# Patient Record
Sex: Female | Born: 1985
Health system: Southern US, Community
[De-identification: ages and names within clinical notes are randomized; demographics above are authoritative.]

## PROBLEM LIST (undated history)

## (undated) ENCOUNTER — Inpatient Hospital Stay: Payer: Self-pay

## (undated) DIAGNOSIS — G43909 Migraine, unspecified, not intractable, without status migrainosus: Secondary | ICD-10-CM

## (undated) DIAGNOSIS — U071 COVID-19: Secondary | ICD-10-CM

## (undated) DIAGNOSIS — I82409 Acute embolism and thrombosis of unspecified deep veins of unspecified lower extremity: Secondary | ICD-10-CM

## (undated) DIAGNOSIS — R87619 Unspecified abnormal cytological findings in specimens from cervix uteri: Secondary | ICD-10-CM

## (undated) DIAGNOSIS — A64 Unspecified sexually transmitted disease: Secondary | ICD-10-CM

## (undated) HISTORY — DX: Unspecified abnormal cytological findings in specimens from cervix uteri: R87.619

## (undated) HISTORY — DX: Unspecified sexually transmitted disease: A64

## (undated) HISTORY — DX: Acute embolism and thrombosis of unspecified deep veins of unspecified lower extremity: I82.409

## (undated) HISTORY — DX: Migraine, unspecified, not intractable, without status migrainosus: G43.909

## (undated) HISTORY — DX: COVID-19: U07.1

---

## 2007-11-16 HISTORY — PX: CERVICAL BIOPSY  W/ LOOP ELECTRODE EXCISION: SUR135

## 2008-10-04 ENCOUNTER — Ambulatory Visit: Payer: Self-pay

## 2008-10-08 ENCOUNTER — Ambulatory Visit: Payer: Self-pay

## 2011-10-28 DIAGNOSIS — J040 Acute laryngitis: Secondary | ICD-10-CM

## 2011-10-28 DIAGNOSIS — J029 Acute pharyngitis, unspecified: Secondary | ICD-10-CM | POA: Insufficient documentation

## 2011-10-28 HISTORY — DX: Acute pharyngitis, unspecified: J02.9

## 2011-10-28 HISTORY — DX: Acute laryngitis: J04.0

## 2012-11-15 DIAGNOSIS — I82409 Acute embolism and thrombosis of unspecified deep veins of unspecified lower extremity: Secondary | ICD-10-CM

## 2012-11-15 HISTORY — DX: Acute embolism and thrombosis of unspecified deep veins of unspecified lower extremity: I82.409

## 2013-08-16 ENCOUNTER — Emergency Department: Payer: Self-pay | Admitting: Internal Medicine

## 2013-08-16 ENCOUNTER — Ambulatory Visit: Payer: Self-pay | Admitting: Family Medicine

## 2013-08-16 LAB — BASIC METABOLIC PANEL
Anion Gap: 7 (ref 7–16)
BUN: 10 mg/dL (ref 7–18)
Chloride: 105 mmol/L (ref 98–107)
Co2: 25 mmol/L (ref 21–32)
Glucose: 76 mg/dL (ref 65–99)
Osmolality: 272 (ref 275–301)
Potassium: 3.6 mmol/L (ref 3.5–5.1)
Sodium: 137 mmol/L (ref 136–145)

## 2013-08-16 LAB — CBC
MCHC: 34.4 g/dL (ref 32.0–36.0)
MCV: 93 fL (ref 80–100)
Platelet: 200 10*3/uL (ref 150–440)
RBC: 3.93 10*6/uL (ref 3.80–5.20)
RDW: 13.4 % (ref 11.5–14.5)

## 2013-08-22 ENCOUNTER — Ambulatory Visit: Payer: Self-pay | Admitting: Hematology and Oncology

## 2013-08-24 ENCOUNTER — Ambulatory Visit: Payer: Self-pay | Admitting: Hematology and Oncology

## 2013-08-24 LAB — TSH: Thyroid Stimulating Horm: 0.351 u[IU]/mL — ABNORMAL LOW

## 2013-09-15 ENCOUNTER — Ambulatory Visit: Payer: Self-pay | Admitting: Hematology and Oncology

## 2013-11-22 ENCOUNTER — Ambulatory Visit: Payer: Self-pay | Admitting: Hematology and Oncology

## 2013-11-22 LAB — CBC CANCER CENTER
Basophil #: 0 x10 3/mm (ref 0.0–0.1)
Basophil %: 0.9 %
EOS ABS: 0 x10 3/mm (ref 0.0–0.7)
Eosinophil %: 0.9 %
HCT: 40.3 % (ref 35.0–47.0)
HGB: 13.5 g/dL (ref 12.0–16.0)
LYMPHS PCT: 38.9 %
Lymphocyte #: 1.6 x10 3/mm (ref 1.0–3.6)
MCH: 30.9 pg (ref 26.0–34.0)
MCHC: 33.4 g/dL (ref 32.0–36.0)
MCV: 93 fL (ref 80–100)
MONO ABS: 0.4 x10 3/mm (ref 0.2–0.9)
Monocyte %: 10.5 %
NEUTROS PCT: 48.8 %
Neutrophil #: 2 x10 3/mm (ref 1.4–6.5)
Platelet: 171 x10 3/mm (ref 150–440)
RBC: 4.35 10*6/uL (ref 3.80–5.20)
RDW: 13.8 % (ref 11.5–14.5)
WBC: 4.1 x10 3/mm (ref 3.6–11.0)

## 2013-11-22 LAB — BASIC METABOLIC PANEL
ANION GAP: 9 (ref 7–16)
BUN: 8 mg/dL (ref 7–18)
CALCIUM: 8.9 mg/dL (ref 8.5–10.1)
CHLORIDE: 102 mmol/L (ref 98–107)
CREATININE: 0.84 mg/dL (ref 0.60–1.30)
Co2: 28 mmol/L (ref 21–32)
EGFR (African American): >60
EGFR (Non-African Amer.): >60
GLUCOSE: 102 mg/dL — AB (ref 65–99)
Osmolality: 276 (ref 275–301)
Potassium: 3.3 mmol/L — ABNORMAL LOW (ref 3.5–5.1)
Sodium: 139 mmol/L (ref 136–145)

## 2013-12-16 ENCOUNTER — Ambulatory Visit: Payer: Self-pay | Admitting: Hematology and Oncology

## 2014-01-13 ENCOUNTER — Ambulatory Visit: Payer: Self-pay | Admitting: Hematology and Oncology

## 2014-09-25 ENCOUNTER — Ambulatory Visit (INDEPENDENT_AMBULATORY_CARE_PROVIDER_SITE_OTHER): Payer: BC Managed Care – PPO | Admitting: General Surgery

## 2014-09-25 ENCOUNTER — Encounter: Payer: Self-pay | Admitting: General Surgery

## 2014-09-25 ENCOUNTER — Ambulatory Visit: Payer: BC Managed Care – PPO

## 2014-09-25 VITALS — BP 112/78 | HR 72 | Resp 14 | Ht 65.0 in | Wt 169.0 lb

## 2014-09-25 DIAGNOSIS — Z86718 Personal history of other venous thrombosis and embolism: Secondary | ICD-10-CM

## 2014-09-25 NOTE — Patient Instructions (Signed)
Patient to return in 1 month for follow up. The patient is aware to call back for any questions or concerns.  

## 2014-09-25 NOTE — Progress Notes (Signed)
Patient ID: Emily Griffin, female   DOB: Aug 24, 1986, 28 y.o.   MRN: [id number]  Chief Complaint  Patient presents with  . Other    History of DVT, shortness of breath and abdominal cramping    HPI Emily Griffin is a 28 y.o. female who presents for an evaluation of abdominal cramping and shortness of breath. The patient has a history of DVT in 2014 in the right leg. She states the shortness of breath started approximately 2 weeks ago. She notices it the  most when walking.  She also states she has had some tingling in the right leg that started approximately 2 days ago. She denies any pain in her legs at this time.   HPI  Past Medical History  Diagnosis Date  . DVT (deep venous thrombosis) 2014    right leg  . STD (female)     Past Surgical History  Procedure Laterality Date  . Cervical biopsy  w/ loop electrode excision  2009    Family History  Problem Relation Age of Onset  . Asthma Brother     Social History History  Substance Use Topics  . Smoking status: Never Smoker   . Smokeless tobacco: Never Used  . Alcohol Use: 0.0 oz/week    0 Not specified per week    Allergies  Allergen Reactions  . Amoxicillin Rash    No current outpatient prescriptions on file.   No current facility-administered medications for this visit.    Review of Systems Review of Systems  Constitutional: Negative.   Respiratory: Positive for shortness of breath.   Cardiovascular: Negative.   Gastrointestinal: Positive for abdominal pain.    Blood pressure 112/78, pulse 72, resp. rate 14, height 5\' 5"  (1.651 m), weight 169 lb (76.658 kg), last menstrual period 09/25/2014.  Physical Exam Physical Exam  Constitutional: She is oriented to person, place, and time. She appears well-developed and well-nourished.  Cardiovascular: Normal rate, regular rhythm and normal heart sounds.   No murmur heard. Pulses:      Dorsalis pedis pulses are 2+ on the right side, and 2+ on the left side.   Posterior tibial pulses are 2+ on the right side, and 2+ on the left side.  No edema in legs . No calf tenderness. No VV or stasis changes.  Pulmonary/Chest: Effort normal and breath sounds normal.  Abdominal: Soft. Normal appearance and bowel sounds are normal. There is no hepatosplenomegaly. There is no tenderness. No hernia.  Neurological: She is alert and oriented to person, place, and time.  Skin: Skin is warm and dry.    Data Reviewed Recent pelvic US- normal. Prior Duplex study reports showed initial clots in tibial veins of right leg  Assessment    History of DVT right tibial veins, treated with Xarelto. Duplex study done today showed no evidence of DVT in right fem-pop veins. Tibial veins were difficult to visualise.     No apparent findings of recurrent DVT  Plan    Recommnde proper use of compression stocking. Avoid prolonged sitting or standing. Recheck in 1 mo         SANKAR,SEEPLAPUTHUR G 09/25/2014, 1:33 PM

## 2014-10-24 ENCOUNTER — Ambulatory Visit: Payer: BC Managed Care – PPO | Admitting: General Surgery

## 2014-11-12 ENCOUNTER — Encounter: Payer: Self-pay | Admitting: *Deleted

## 2014-11-15 NOTE — L&D Delivery Note (Signed)
VAGINAL DELIVERY NOTE:  Date of Delivery: 11/04/2015 Primary OB: [facility]  Gestational Age/EDD: 5354w2d 11/09/2015, by Last Menstrual Period Antepartum complications: h/o DVT on Lovenox Attending Physician: Annamarie MajorPaul Sophi Calligan, MD, FACOG Delivery Type: spontaneous vaginal delivery  Anesthesia: epidural Laceration: 1st degree minor Episiotomy: none Placenta: spontaneous Intrapartum complications: None Estimated Blood Loss: <100 mL GBS: Neg Baby: Liveborn female, Apgars 8/9

## 2015-03-31 ENCOUNTER — Ambulatory Visit
Admission: RE | Admit: 2015-03-31 | Discharge: 2015-03-31 | Disposition: A | Discharge status: Home / Self Care | Payer: BLUE CROSS/BLUE SHIELD | Source: Ambulatory Visit | Attending: Maternal & Fetal Medicine | Admitting: Maternal & Fetal Medicine

## 2015-03-31 ENCOUNTER — Encounter: Payer: Self-pay | Admitting: Maternal & Fetal Medicine

## 2015-03-31 NOTE — Progress Notes (Unsigned)
MFM Consultation  Referring Provider: [facility]  29 yo G2P0010 at 8 1/[redacted] weeks gestation (EDD 11/10/15) by LMP 02/02/2015 consistent with [facility]; CRL was consistent with 7 weeks 4 days.  She is referred for consultation due to history of right distal popliteal thrombosis in 2014 while taking OCPs.  She was treated with xeralta for 3 months.  She reports a hematology evaluation to test for genetic and other underlying causes for DVT but results are not available to me(heme consult not in EMR or received records).  She reports negative testing.    History OB History    Gravida Para Term Preterm AB TAB SAB Ectopic Multiple Living   2 0     0         Obstetric Comments   1st Menstrual Cycle:  13 1st Pregnancy:  25     she reports history of abnormal pap smear and LEEP EAb x 1 (per records)  Past Medical History History of DVT, as above   Past Surgical History  Procedure Laterality Date  . Cervical biopsy  w/ loop electrode excision  2009   Family History: family history includes Asthma in her brother. she reports vascular insufficiency in grand mother and brother (brother is also being evaluated for evidence of dvts, per pt.   Social History: she works in [employer] (from home) does not drink presently (did drink alcohol prior to pregnancy), no tobacco or ilicit drug use  Allergies  Allergen Reactions  . Amoxicillin Rash   Current Outpatient Prescriptions on File Prior to Visit  Medication Sig Dispense Refill  . aspirin 81 MG tablet Take 81 mg by mouth daily.    . Cetirizine HCl 10 MG CAPS Take 10 mg by mouth once.    . Prenatal Vit-Fe Fumarate-FA (PRENATAL MULTIVITAMIN) TABS tablet Take 1 tablet by mouth daily at 12 noon.     No current facility-administered medications on file prior to visit.      ROS see HPI    Last menstrual period 02/02/2015. Exam Physical Exam   Prenatal labs: ABO, Rh:  O positive Antibody:  negaive Rubella:   immune RPR:   non-reactive HBsAg:   negative HIV:   negative Gc/chlam: negative  Lab Results  Component Value Date   WBC 4.1 11/22/2013   HGB 13.5 11/22/2013   HCT 40.3 11/22/2013   MCV 93 11/22/2013   PLT 171 11/22/2013      Assessment/Plan: 1. 28 yo G2 P0010  With history of DVT while on OCPs.  She was treated with xeralta for 3 months.  We addressed the increased risk for thrombosis during pregnancy and postpartum. She reports evaluation by a hematologist in the past (for genetic and other causes of DVT) and was told she did not have underlying causes for DVT.  Those results were not available.  --Recommend prophylactic lovenox 40mg  Riggins daily  during pregnancy and for 6 weeks postpartum. She received lovenox teaching today and a prescription for her lovenox dose --fetal growth evaluation monthly beginning at 26-[redacted] weeks gestation. --Antenatal testing weekly beginning at 32-[redacted] weeks gestation, sooner if clinically indicated --delivery at 39-[redacted] weeks gestation --recommend holding lovenox with onset of labor or 12 hours before planned cesarean delivery--we addressed the primarily limitation of continued lovenox therapy during labor and cesarean delivery is the concern for spinal hematoma following regional anesthesia placement.  If urgent delivery is warranted or occurs during an interval less than 12h,  general anesthetic (in the case  of cesarean delivery) or narcotic analgesia ( in the setting of vaginal delivery) may be recommended --recommend initiating lovenox 12 hours after uncomplicated vaginal delivery or 24 hours after cesarean delivery    2. History of LEEP--We addressed the possible association between LEEP and cervical weakness or preterm delivery.   --recommend assessment of the cervical length at ~[redacted] weeks gestation (during the anatomic survey) if normal (>2.5 cm) recommend follow up in 2-3 weeks. If the cervical length is normal at the time of those two assessments,  routine, clinical surveillance for preterm birth can continue.    Alanda SlimMaria J Kenlyn Lose 03/31/2015, 3:00 PM

## 2015-08-08 LAB — OB RESULTS CONSOLE GC/CHLAMYDIA
Chlamydia: NEGATIVE
GC PROBE AMP, GENITAL: NEGATIVE

## 2015-08-08 LAB — OB RESULTS CONSOLE RUBELLA ANTIBODY, IGM: Rubella: IMMUNE

## 2015-08-08 LAB — OB RESULTS CONSOLE VARICELLA ZOSTER ANTIBODY, IGG: Varicella: IMMUNE

## 2015-08-08 LAB — OB RESULTS CONSOLE HEPATITIS B SURFACE ANTIGEN: HEP B S AG: NEGATIVE

## 2015-08-08 LAB — OB RESULTS CONSOLE ABO/RH: RH TYPE: POSITIVE

## 2015-08-08 LAB — OB RESULTS CONSOLE RPR: RPR: NONREACTIVE

## 2015-08-08 LAB — OB RESULTS CONSOLE ANTIBODY SCREEN: Antibody Screen: NEGATIVE

## 2015-08-08 LAB — OB RESULTS CONSOLE HIV ANTIBODY (ROUTINE TESTING): HIV: NONREACTIVE

## 2015-09-11 ENCOUNTER — Ambulatory Visit
Admission: RE | Admit: 2015-09-11 | Discharge: 2015-09-11 | Disposition: A | Discharge status: Home / Self Care | Payer: BLUE CROSS/BLUE SHIELD | Source: Ambulatory Visit | Attending: Obstetrics and Gynecology | Admitting: Obstetrics and Gynecology

## 2015-09-11 ENCOUNTER — Other Ambulatory Visit: Payer: Self-pay | Admitting: Obstetrics and Gynecology

## 2015-09-11 DIAGNOSIS — Z3A31 31 weeks gestation of pregnancy: Secondary | ICD-10-CM | POA: Diagnosis present

## 2015-09-11 DIAGNOSIS — R600 Localized edema: Secondary | ICD-10-CM

## 2015-10-06 LAB — OB RESULTS CONSOLE GBS: STREP GROUP B AG: NEGATIVE

## 2015-11-01 ENCOUNTER — Observation Stay
Admission: EM | Admit: 2015-11-01 | Discharge: 2015-11-01 | Disposition: A | Discharge status: Home / Self Care | Payer: BLUE CROSS/BLUE SHIELD | Source: Home / Self Care | Admitting: Obstetrics and Gynecology

## 2015-11-01 DIAGNOSIS — Z86718 Personal history of other venous thrombosis and embolism: Secondary | ICD-10-CM

## 2015-11-01 DIAGNOSIS — Z3A38 38 weeks gestation of pregnancy: Secondary | ICD-10-CM | POA: Insufficient documentation

## 2015-11-01 DIAGNOSIS — O0993 Supervision of high risk pregnancy, unspecified, third trimester: Secondary | ICD-10-CM

## 2015-11-01 NOTE — Progress Notes (Signed)
Pt was given d/c inst. And verbalized understanding.   She was then d/c home in stable condition with her parents.

## 2015-11-01 NOTE — Progress Notes (Signed)
Report given to Dr Jackson

## 2015-11-01 NOTE — Progress Notes (Signed)
Dr. Jean RosenthalJackson in to see patient and review strip,

## 2015-11-01 NOTE — OB Triage Note (Signed)
Pt to L&D with c/o contractions since 1400 q 15 minutes getting worse around 1900.  Denies ROM, VB   +FM

## 2015-11-01 NOTE — Final Progress Note (Signed)
Physician Final Progress Note  Patient ID: Emily BeltonLatavia Griffin MRN: [id number] DOB/AGE: 31-Feb-1987 31 y.o.  Admit date: 11/01/2015 Admitting provider: Conard NovakStephen D Indalecio Malmstrom, MD Discharge date: 11/01/2015   Admission Diagnoses:  1) intrauterine pregnancy at 5869w6d  2) history of DVT, taking prophylaxis 3) history of LEEP 4) supervision of high-risk pregnancy, 3rd trimester  Discharge Diagnoses:  1) intrauterine pregnancy at 5869w6d  2) history of DVT, taking prophylaxis 3) history of LEEP 4) supervision of high-risk pregnancy, 3rd trimester  Consults: None  Significant Findings/ Diagnostic Studies: None  Procedures: Non-stress test Baseline: 130bpm Variability: Moderate Accels: present Decels: absent  Discharge Condition: stable  Disposition: Final discharge disposition not confirmed  Diet: Regular diet  Discharge Activity: Activity as tolerated     Medication List    ASK your doctor about these medications        aspirin 81 MG tablet  Take 81 mg by mouth daily.     Cetirizine HCl 10 MG Caps  Take 10 mg by mouth once.     prenatal multivitamin Tabs tablet  Take 1 tablet by mouth daily at 12 noon.         Total time spent taking care of this patient: 20 minutes  Signed: Conard NovakJackson, Dalene Robards D, MD 11/01/2015, 10:02 PM

## 2015-11-03 ENCOUNTER — Inpatient Hospital Stay
Admission: EM | Admit: 2015-11-03 | Discharge: 2015-11-05 | DRG: 775 | Disposition: A | Discharge status: Home / Self Care | Payer: BLUE CROSS/BLUE SHIELD | Attending: Obstetrics & Gynecology | Admitting: Obstetrics & Gynecology

## 2015-11-03 ENCOUNTER — Encounter: Payer: Self-pay | Admitting: Anesthesiology

## 2015-11-03 ENCOUNTER — Inpatient Hospital Stay: Payer: BLUE CROSS/BLUE SHIELD | Admitting: Anesthesiology

## 2015-11-03 ENCOUNTER — Encounter: Payer: Self-pay | Admitting: *Deleted

## 2015-11-03 DIAGNOSIS — Z86718 Personal history of other venous thrombosis and embolism: Secondary | ICD-10-CM | POA: Diagnosis not present

## 2015-11-03 DIAGNOSIS — O09893 Supervision of other high risk pregnancies, third trimester: Secondary | ICD-10-CM

## 2015-11-03 DIAGNOSIS — Z8672 Personal history of thrombophlebitis: Secondary | ICD-10-CM

## 2015-11-03 DIAGNOSIS — Z7901 Long term (current) use of anticoagulants: Secondary | ICD-10-CM | POA: Diagnosis not present

## 2015-11-03 DIAGNOSIS — Z3A39 39 weeks gestation of pregnancy: Secondary | ICD-10-CM

## 2015-11-03 DIAGNOSIS — Z7982 Long term (current) use of aspirin: Secondary | ICD-10-CM

## 2015-11-03 LAB — CBC
HEMATOCRIT: 37.4 % (ref 35.0–47.0)
Hemoglobin: 12.4 g/dL (ref 12.0–16.0)
MCH: 30.7 pg (ref 26.0–34.0)
MCHC: 33.3 g/dL (ref 32.0–36.0)
MCV: 92.3 fL (ref 80.0–100.0)
Platelets: 185 10*3/uL (ref 150–440)
RBC: 4.05 MIL/uL (ref 3.80–5.20)
RDW: 17.8 % — AB (ref 11.5–14.5)
WBC: 12.7 10*3/uL — AB (ref 3.6–11.0)

## 2015-11-03 LAB — RAPID HIV SCREEN (HIV 1/2 AB+AG)
HIV 1/2 Antibodies: NONREACTIVE
HIV-1 P24 Antigen - HIV24: NONREACTIVE

## 2015-11-03 LAB — TYPE AND SCREEN
ABO/RH(D): O POS
Antibody Screen: NEGATIVE

## 2015-11-03 LAB — ABO/RH: ABO/RH(D): O POS

## 2015-11-03 MED ORDER — OXYTOCIN BOLUS FROM INFUSION: 500.0000 mL | INTRAVENOUS | Status: DC

## 2015-11-03 MED ORDER — OXYTOCIN 40 UNITS IN LACTATED RINGERS INFUSION - SIMPLE MED: 62.5000 mL/h | INTRAVENOUS | Status: DC

## 2015-11-03 MED ORDER — BUPIVACAINE HCL (PF) 0.25 % IJ SOLN
INTRAMUSCULAR | Status: DC | PRN
  Administered 2015-11-03: 5 mL via EPIDURAL
  Administered 2015-11-03: 3 mL via EPIDURAL

## 2015-11-03 MED ORDER — ONDANSETRON HCL 4 MG/2ML IJ SOLN: 4.0000 mg | Freq: Four times a day (QID) | INTRAMUSCULAR | Status: DC | PRN

## 2015-11-03 MED ORDER — BUTORPHANOL TARTRATE 1 MG/ML IJ SOLN: 1.0000 mg | INTRAMUSCULAR | Status: DC | PRN

## 2015-11-03 MED ORDER — ACETAMINOPHEN 325 MG PO TABS: 650.0000 mg | ORAL_TABLET | ORAL | Status: DC | PRN

## 2015-11-03 MED ORDER — LACTATED RINGERS IV SOLN
INTRAVENOUS | Status: DC
  Administered 2015-11-03 (×2): via INTRAVENOUS

## 2015-11-03 MED ORDER — FENTANYL 2.5 MCG/ML W/ROPIVACAINE 0.2% IN NS 100 ML EPIDURAL INFUSION (ARMC-ANES)
EPIDURAL | Status: AC
  Administered 2015-11-03: 9 mL/h via EPIDURAL
  Filled 2015-11-03: qty 100

## 2015-11-03 MED ORDER — LIDOCAINE HCL (PF) 1 % IJ SOLN
INTRAMUSCULAR | Status: DC | PRN
  Administered 2015-11-03: 3 mL via INTRADERMAL

## 2015-11-03 MED ORDER — OXYTOCIN 40 UNITS IN LACTATED RINGERS INFUSION - SIMPLE MED
1.0000 m[IU]/min | INTRAVENOUS | Status: DC
  Administered 2015-11-03: 1 m[IU]/min via INTRAVENOUS
  Filled 2015-11-03: qty 1000

## 2015-11-03 MED ORDER — TERBUTALINE SULFATE 1 MG/ML IJ SOLN: 0.2500 mg | Freq: Once | INTRAMUSCULAR | Status: DC | PRN

## 2015-11-03 MED ORDER — LIDOCAINE-EPINEPHRINE (PF) 1.5 %-1:200000 IJ SOLN
INTRAMUSCULAR | Status: DC | PRN
  Administered 2015-11-03: 3 mL via PERINEURAL

## 2015-11-03 MED ORDER — LACTATED RINGERS IV SOLN: 500.0000 mL | INTRAVENOUS | Status: DC | PRN

## 2015-11-03 NOTE — Progress Notes (Signed)
  Labor Progress Note   29 y.o. G2P0 @ 4929w1d , admitted for  Pregnancy, Labor Management. 40+ weeks.  Early labor on admission.  Subjective:  Painful ctxs improved w epidural   Objective:  BP 112/64 mmHg  Pulse 90  Temp(Src) 98.3 F (36.8 C) (Oral)  Resp 20  Ht 5\' 5"  (1.651 m)  Wt 210 lb (95.255 kg)  BMI 34.95 kg/m2  SpO2 98%  LMP 02/02/2015 Abd: moderate Extr: trace to 1+ bilateral pedal edema SVE: CERVIX: 7 cm dilated, 90 effaced, +1 station  EFM: FHR: 130 bpm, variability: moderate,  accelerations:  Present,  decelerations:  Present early and variable w ctxs Toco: Frequency: Every 2-4 minutes Labs: I have reviewed the patient's lab results.   Assessment & Plan:  G2P0 @ 829w1d, admitted for  Pregnancy and Labor/Delivery Management  1. Pain management: none. 2. FWB: FHT category 1.  3. ID: GBS negative 4. Labor management:   Pitocin for augmentation.  FSE applied.  All discussed with patient, see orders

## 2015-11-03 NOTE — Progress Notes (Signed)
  Labor Progress Note   29 y.o. G2P0 @ 2473w1d , admitted for  Pregnancy, Labor Management. 40+ weeks.  Early labor on admission.  Subjective:  Painful ctxs.   Objective:  BP 112/71 mmHg  Pulse 89  Temp(Src) 97.9 F (36.6 C) (Oral)  Resp 18  Ht 5\' 5"  (1.651 m)  Wt 210 lb (95.255 kg)  BMI 34.95 kg/m2  LMP 02/02/2015 Abd: moderate Extr: trace to 1+ bilateral pedal edema SVE: CERVIX: 4 cm dilated, 90 effaced, 0 station  EFM: FHR: 130 bpm, variability: moderate,  accelerations:  Present,  decelerations:  Absent Toco: Frequency: Every 10 minutes Labs: I have reviewed the patient's lab results.   Assessment & Plan:  G2P0 @ 173w1d, admitted for  Pregnancy and Labor/Delivery Management  1. Pain management: none. 2. FWB: FHT category 1.  3. ID: GBS negative 4. Labor management: AROM clear.  Pitocin for augmentation.    All discussed with patient, see orders

## 2015-11-03 NOTE — Progress Notes (Signed)
  Labor Progress Note   29 y.o. G2P0 @ 127w1d , admitted for  Pregnancy, Labor Management. 40+ weeks.  Early labor on admission.  Subjective:  Comfortable   Objective:  BP 125/79 mmHg  Pulse 93  Temp(Src) 99.4 F (37.4 C) (Oral)  Resp 20  Ht 5\' 5"  (1.651 m)  Wt 210 lb (95.255 kg)  BMI 34.95 kg/m2  SpO2 98%  LMP 02/02/2015 Abd: moderate Extr: trace to 1+ bilateral pedal edema SVE: CERVIX: 9 cm dilated, 90 effaced, +1 station  EFM: FHR: 130 bpm, variability: moderate,  accelerations:  Present,  decelerations:  Present early and variable w ctxs Toco: Frequency: Every 2-4 minutes Labs: I have reviewed the patient's lab results.   Assessment & Plan:  G2P0 @ 367w1d, admitted for  Pregnancy and Labor/Delivery Management  1. Pain management: none. 2. FWB: FHT category 1.  3. ID: GBS negative 4. Labor management:   Pitocin for augmentation.   All discussed with patient, see orders

## 2015-11-03 NOTE — Anesthesia Preprocedure Evaluation (Signed)
Anesthesia Evaluation  Patient identified by MRN, date of birth, ID band Patient awake    Reviewed: Allergy & Precautions, H&P , NPO status   History of Anesthesia Complications Negative for: history of anesthetic complications  Airway Mallampati: II  TM Distance: <3 FB Neck ROM: full    Dental no notable dental hx. (+) Teeth Intact   Pulmonary neg pulmonary ROS,    Pulmonary exam normal breath sounds clear to auscultation       Cardiovascular negative cardio ROS Normal cardiovascular exam Rhythm:regular Rate:Normal     Neuro/Psych negative neurological ROS  negative psych ROS   GI/Hepatic negative GI ROS, Neg liver ROS,   Endo/Other  negative endocrine ROS  Renal/GU negative Renal ROS  negative genitourinary   Musculoskeletal   Abdominal   Peds  Hematology   Anesthesia Other Findings   Reproductive/Obstetrics (+) Pregnancy                             Anesthesia Physical Anesthesia Plan  ASA: II  Anesthesia Plan: Epidural   Post-op Pain Management:    Induction:   Airway Management Planned:   Additional Equipment:   Intra-op Plan:   Post-operative Plan:   Informed Consent: I have reviewed the patients History and Physical, chart, labs and discussed the procedure including the risks, benefits and alternatives for the proposed anesthesia with the patient or authorized representative who has indicated his/her understanding and acceptance.     Plan Discussed with: Anesthesiologist and CRNA  Anesthesia Plan Comments:         Anesthesia Quick Evaluation

## 2015-11-03 NOTE — H&P (Signed)
Obstetrics Admission History & Physical   CC: ctxs and pain   HPI:  29 y.o. G2P0 @ 3874w1d (11/09/2015, by Last Menstrual Period). Admitted on 11/03/2015:   Patient Active Problem List   Diagnosis Date Noted  . Labor and delivery, indication for care 11/01/2015    Presents for ctx pain since 12/17, worse this am.  No VB or ROM.  Has not taken Lovenox in 2 days.  Denies leg cramps or SOB.Marland Kitchen.  [facility]  PMHx:  Past Medical History  Diagnosis Date  . DVT (deep venous thrombosis) (HCC) 2014    right leg  . STD (female)    PSHx:  Past Surgical History  Procedure Laterality Date  . Cervical biopsy  w/ loop electrode excision  2009   Medications:  Prescriptions prior to admission  Medication Sig Dispense Refill Last Dose  . enoxaparin (LOVENOX) 40 MG/0.4ML injection Inject 40 mg into the skin daily.   10/31/2015  . Prenatal Vit-Fe Fumarate-FA (PRENATAL MULTIVITAMIN) TABS tablet Take 1 tablet by mouth daily at 12 noon.   11/02/2015 at Unknown time  . valACYclovir (VALTREX) 500 MG tablet Take 500 mg by mouth 2 (two) times daily.   11/02/2015 at Unknown time  . aspirin 81 MG tablet Take 81 mg by mouth daily. Reported on 11/03/2015   Not Taking at Unknown time  . Cetirizine HCl 10 MG CAPS Take 10 mg by mouth once. Reported on 11/03/2015   Not Taking at Unknown time   Allergies: is allergic to amoxicillin. OBHx:  OB History  Gravida Para Term Preterm AB SAB TAB Ectopic Multiple Living  2 0    0        # Outcome Date GA Lbr Len/2nd Weight Sex Delivery Anes PTL Lv  2 Current           1 Gravida             Obstetric Comments  1st Menstrual Cycle:  13  1st Pregnancy:  25   ZOX:[id number]:Negative/unremarkable except as detailed in HPI. Soc Hx: Pregnancy welcomed  Objective:   Filed Vitals:   11/03/15 0936  BP: 112/71  Pulse: 89  Temp: 97.9 F (36.6 C)  Resp: 18   General: Well nourished, well developed female in no acute distress.  Skin: Warm and dry.   Cardiovascular:Regular rate and rhythm. Respiratory: Clear to auscultation bilateral. Normal respiratory effort Abdomen: moderate Neuro/Psych: Normal mood and affect.   Pelvic exam: is not limited by body habitus EGBUS: within normal limits Vagina: within normal limits and with normal mucosa blood in the vault Cervix: 3 cm Uterus: Spontaneous uterine activity  Adnexa: not evaluated  EFM:FHR: 130 bpm, variability: moderate,  accelerations:  Present,  decelerations:  Absent Toco: Frequency: Every 6-9 minutes   Perinatal info:  Blood type: O positive Rubella- Immune Varicella -Immune TDaP unkown RPR NR / HIV Neg/ HBsAg Neg  GBS NEG  Assessment & Plan:   29 y.o. G2P0 @ 1974w1d, Admitted on 11/03/2015: early active labor  Admit for labor, Observe for cervical change, Fetal Wellbeing Reassuring, Epidural when ready and AROM when Appropriate  Resume Lovenox 40mg  SQ daily after delivery (12 hours after vag delivery)

## 2015-11-03 NOTE — Anesthesia Procedure Notes (Signed)
Epidural Patient location during procedure: OB Start time: 11/03/2015 3:30 PM End time: 11/03/2015 4:00 PM  Staffing Resident/CRNA: Luca Dyar Performed by: resident/CRNA   Preanesthetic Checklist Completed: patient identified, site marked, surgical consent, pre-op evaluation, timeout performed, IV checked, risks and benefits discussed and monitors and equipment checked  Epidural Patient position: sitting Prep: Betadine Patient monitoring: heart rate, continuous pulse ox and blood pressure Approach: midline Location: L4-L5 Injection technique: LOR saline  Needle:  Needle type: Tuohy  Needle gauge: 18 G Needle length: 9 cm and 9 Catheter type: closed end flexible Catheter size: 20 Guage Test dose: negative and 1.5% lidocaine with Epi 1:200 K  Assessment Sensory level: T10 Events: blood not aspirated, injection not painful, no injection resistance, negative IV test and no paresthesia  Additional Notes   Patient tolerated the insertion well without complications.Reason for block:procedure for pain

## 2015-11-04 DIAGNOSIS — O09893 Supervision of other high risk pregnancies, third trimester: Secondary | ICD-10-CM | POA: Diagnosis not present

## 2015-11-04 LAB — CBC
HEMATOCRIT: 34.9 % — AB (ref 35.0–47.0)
Hemoglobin: 11.7 g/dL — ABNORMAL LOW (ref 12.0–16.0)
MCH: 31 pg (ref 26.0–34.0)
MCHC: 33.4 g/dL (ref 32.0–36.0)
MCV: 92.8 fL (ref 80.0–100.0)
PLATELETS: 163 10*3/uL (ref 150–440)
RBC: 3.76 MIL/uL — ABNORMAL LOW (ref 3.80–5.20)
RDW: 18.2 % — AB (ref 11.5–14.5)
WBC: 17.3 10*3/uL — AB (ref 3.6–11.0)

## 2015-11-04 LAB — RPR: RPR: NONREACTIVE

## 2015-11-04 MED ORDER — IBUPROFEN 600 MG PO TABS
600.0000 mg | ORAL_TABLET | Freq: Four times a day (QID) | ORAL | Status: DC
  Administered 2015-11-05 (×2): 600 mg via ORAL
  Filled 2015-11-04 (×3): qty 1

## 2015-11-04 MED ORDER — LIDOCAINE HCL (PF) 1 % IJ SOLN
INTRAMUSCULAR | Status: AC
  Filled 2015-11-04: qty 30

## 2015-11-04 MED ORDER — MISOPROSTOL 200 MCG PO TABS
ORAL_TABLET | ORAL | Status: AC
  Filled 2015-11-04: qty 4

## 2015-11-04 MED ORDER — ENOXAPARIN SODIUM 40 MG/0.4ML ~~LOC~~ SOLN
40.0000 mg | SUBCUTANEOUS | Status: DC
  Administered 2015-11-04: 40 mg via SUBCUTANEOUS
  Filled 2015-11-04: qty 0.4

## 2015-11-04 MED ORDER — SODIUM CHLORIDE 0.9 % IJ SOLN: 3.0000 mL | INTRAMUSCULAR | Status: DC | PRN

## 2015-11-04 MED ORDER — OXYTOCIN 40 UNITS IN LACTATED RINGERS INFUSION - SIMPLE MED: 62.5000 mL/h | INTRAVENOUS | Status: DC | PRN

## 2015-11-04 MED ORDER — FENTANYL 2.5 MCG/ML W/ROPIVACAINE 0.2% IN NS 100 ML EPIDURAL INFUSION (ARMC-ANES)
EPIDURAL | Status: AC
  Administered 2015-11-04: 250 ug
  Filled 2015-11-04: qty 100

## 2015-11-04 MED ORDER — SENNOSIDES-DOCUSATE SODIUM 8.6-50 MG PO TABS
2.0000 | ORAL_TABLET | ORAL | Status: DC
  Administered 2015-11-05: 2 via ORAL
  Filled 2015-11-04: qty 2

## 2015-11-04 MED ORDER — SIMETHICONE 80 MG PO CHEW: 80.0000 mg | CHEWABLE_TABLET | ORAL | Status: DC | PRN

## 2015-11-04 MED ORDER — OXYTOCIN 10 UNIT/ML IJ SOLN
INTRAMUSCULAR | Status: AC
  Filled 2015-11-04: qty 2

## 2015-11-04 MED ORDER — SODIUM CHLORIDE 0.9 % IJ SOLN: 3.0000 mL | Freq: Two times a day (BID) | INTRAMUSCULAR | Status: DC

## 2015-11-04 MED ORDER — OXYCODONE-ACETAMINOPHEN 5-325 MG PO TABS: 2.0000 | ORAL_TABLET | ORAL | Status: DC | PRN

## 2015-11-04 MED ORDER — ACETAMINOPHEN 325 MG PO TABS: 650.0000 mg | ORAL_TABLET | ORAL | Status: DC | PRN

## 2015-11-04 MED ORDER — INFLUENZA VAC SPLIT QUAD 0.5 ML IM SUSY
0.5000 mL | PREFILLED_SYRINGE | INTRAMUSCULAR | Status: DC
  Filled 2015-11-04: qty 0.5

## 2015-11-04 MED ORDER — DIBUCAINE 1 % RE OINT: 1.0000 "application " | TOPICAL_OINTMENT | RECTAL | Status: DC | PRN

## 2015-11-04 MED ORDER — DIPHENHYDRAMINE HCL 25 MG PO CAPS: 25.0000 mg | ORAL_CAPSULE | Freq: Four times a day (QID) | ORAL | Status: DC | PRN

## 2015-11-04 MED ORDER — TETANUS-DIPHTH-ACELL PERTUSSIS 5-2.5-18.5 LF-MCG/0.5 IM SUSP: 0.5000 mL | Freq: Once | INTRAMUSCULAR | Status: DC

## 2015-11-04 MED ORDER — LANOLIN HYDROUS EX OINT
TOPICAL_OINTMENT | CUTANEOUS | Status: DC | PRN
Start: 2015-11-04 — End: 2015-11-05

## 2015-11-04 MED ORDER — SODIUM CHLORIDE 0.9 % IV SOLN: 250.0000 mL | INTRAVENOUS | Status: DC | PRN

## 2015-11-04 MED ORDER — AMMONIA AROMATIC IN INHA
RESPIRATORY_TRACT | Status: AC
  Filled 2015-11-04: qty 10

## 2015-11-04 MED ORDER — ONDANSETRON HCL 4 MG PO TABS: 4.0000 mg | ORAL_TABLET | ORAL | Status: DC | PRN

## 2015-11-04 MED ORDER — BENZOCAINE-MENTHOL 20-0.5 % EX AERO
1.0000 "application " | INHALATION_SPRAY | CUTANEOUS | Status: DC | PRN
  Administered 2015-11-04: 1 via TOPICAL
  Filled 2015-11-04: qty 56

## 2015-11-04 MED ORDER — ZOLPIDEM TARTRATE 5 MG PO TABS: 5.0000 mg | ORAL_TABLET | Freq: Every evening | ORAL | Status: DC | PRN

## 2015-11-04 MED ORDER — WITCH HAZEL-GLYCERIN EX PADS: 1.0000 "application " | MEDICATED_PAD | CUTANEOUS | Status: DC | PRN

## 2015-11-04 MED ORDER — ONDANSETRON HCL 4 MG/2ML IJ SOLN: 4.0000 mg | INTRAMUSCULAR | Status: DC | PRN

## 2015-11-04 MED ORDER — OXYCODONE-ACETAMINOPHEN 5-325 MG PO TABS: 1.0000 | ORAL_TABLET | ORAL | Status: DC | PRN

## 2015-11-04 MED ORDER — IBUPROFEN 600 MG PO TABS
600.0000 mg | ORAL_TABLET | Freq: Four times a day (QID) | ORAL | Status: DC
  Administered 2015-11-04 (×2): 600 mg via ORAL
  Filled 2015-11-04 (×2): qty 1

## 2015-11-04 NOTE — Discharge Summary (Signed)
Obstetrical Discharge Summary  Date of Admission: 11/03/2015 Date of Discharge: 11/05/2015 Discharge Diagnosis: Term Pregnancy-delivered. History of DVT Primary OB:  [facility]   Gestational Age at Delivery: 611w2d  Antepartum complications: h/o DVT on Lovenox Date of Delivery: 11/04/15   Delivered By: Tiburcio PeaHarris Delivery Type: spontaneous vaginal delivery Intrapartum complications/course: None Anesthesia: epidural Placenta: spontaneous Laceration: 1st degree Episiotomy: none Live born Female  Birth Weight:  7#3oz/  APGAR: 8, 9   Post partum course: Since the delivery, patient has tolerated normal activity, diet, and daily functions without difficulty or complication.  Min lochia.  No breast concerns at this time.  No signs of depression currently. Has resumed Lovenox and knows to continue injections x 6 weeks.   Postpartum Exam: General: comfortable, in NAD, breastfeeding baby BP 106/66 mmHg  Pulse 75  Temp(Src) 98 F (36.7 C) (Oral)  Resp 18  Ht 5\' 5"  (1.651 m)  Wt 95.255 kg (210 lb)  BMI 34.95 kg/m2  SpO2 100%  LMP 02/02/2015  Breastfeeding? Unknown Abdomen: soft/ FF at U-3/ML/NT Lochia: appropriate Ext: no evidence of DVT   Disposition: home with infant Rh Immune globulin given: not applicable Rubella vaccine given: no Varicella vaccine given: no Tdap vaccine given in AP or PP setting: yes Contraception: to be determined at post partum visit / considering Liletta IUD Prenatal Labs: O POS//Rubella Immune//RPR negative//HIV negative/HepB Surface Ag negative//plans to breastfeed  Plan:  Graciella BeltonLatavia Flenner was discharged to home in good condition. [facility]  Discharge Medications:   Medication List    STOP taking these medications        aspirin 81 MG tablet     valACYclovir 500 MG tablet  Commonly known as:  VALTREX      TAKE these medications        benzocaine-Menthol 20-0.5 % Aero  Commonly known as:  DERMOPLAST   Apply 1 application topically as needed for irritation (perineal discomfort).     Cetirizine HCl 10 MG Caps  Take 10 mg by mouth once. Reported on 11/03/2015     enoxaparin 40 MG/0.4ML injection  Commonly known as:  LOVENOX  Inject 40 mg into the skin daily.     Influenza vac split quadrivalent PF 0.5 ML injection  Commonly known as:  FLUARIX  Inject 0.5 mLs into the muscle tomorrow at 10 am.     lanolin Oint  Apply 1 application topically as needed (for breast care).     prenatal multivitamin Tabs tablet  Take 1 tablet by mouth daily at 12 noon.        Follow-up arrangements:  FU in 6 weeks   Dailah Opperman, CNM

## 2015-11-04 NOTE — Progress Notes (Signed)
Attempted visit and patient related ok at this time did not need Pastoral Care.

## 2015-11-04 NOTE — Discharge Instructions (Signed)

## 2015-11-05 DIAGNOSIS — Z8672 Personal history of thrombophlebitis: Secondary | ICD-10-CM

## 2015-11-05 HISTORY — DX: Personal history of thrombophlebitis: Z86.72

## 2015-11-05 MED ORDER — LANOLIN HYDROUS EX OINT: 1.0000 "application " | TOPICAL_OINTMENT | CUTANEOUS | Status: DC | PRN

## 2015-11-05 MED ORDER — BENZOCAINE-MENTHOL 20-0.5 % EX AERO: 1.0000 "application " | INHALATION_SPRAY | CUTANEOUS | Status: DC | PRN

## 2015-11-05 MED ORDER — INFLUENZA VAC SPLIT QUAD 0.5 ML IM SUSY: 0.5000 mL | PREFILLED_SYRINGE | INTRAMUSCULAR | Status: DC

## 2015-11-05 NOTE — Anesthesia Postprocedure Evaluation (Signed)
  Anesthesia Post-op Note  Patient: Graciella BeltonLatavia Audia  Procedure(s) Performed: * No procedures listed *  Anesthesia type:epidural  Patient location: 342  Post pain: Pain level controlled  Post assessment: Post-op Vital signs reviewed, Patient's Cardiovascular Status Stable, Respiratory Function Stable, Patent Airway and No signs of Nausea or vomiting  Post vital signs: Reviewed and stable  Last Vitals:  Filed Vitals:   11/05/15 0342 11/05/15 0757  BP: 104/62 106/66  Pulse: 68 75  Temp: 36.7 C 36.6 C  Resp: 18 18    Level of consciousness: awake, alert  and patient cooperative  Complications: No apparent anesthesia complications

## 2015-11-05 NOTE — Progress Notes (Signed)
Discharge instructions complete and prescriptions given. Patient verbalizes understanding of teaching. Pt. Discharged home at 1500.

## 2016-11-13 IMAGING — US US EXTREM LOW VENOUS*R*
1 series · 13 of 24 positions shown · non-contrast
Comparison: None.

CLINICAL DATA: Right lower extremity edema, prior DVT,
discoloration, 31 weeks pregnant.



[Series 1: us extrem low venous*right* · 0.06mm/px · 13 of 31 slices shown]
[im 1/31]
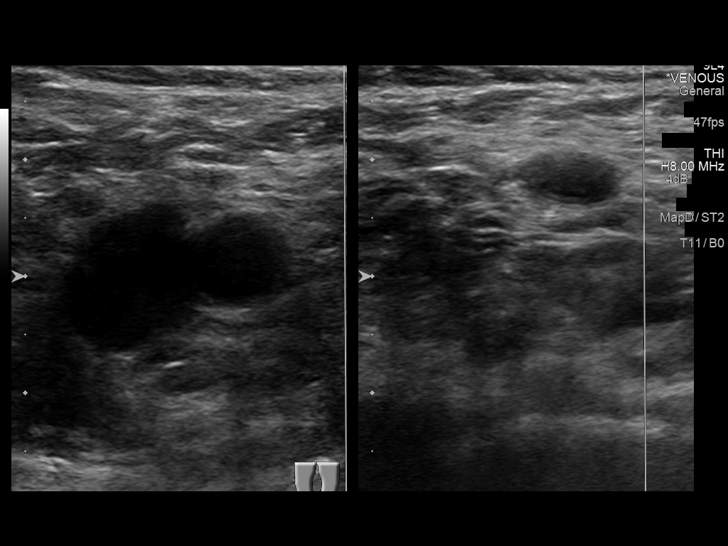
[im 3/31]
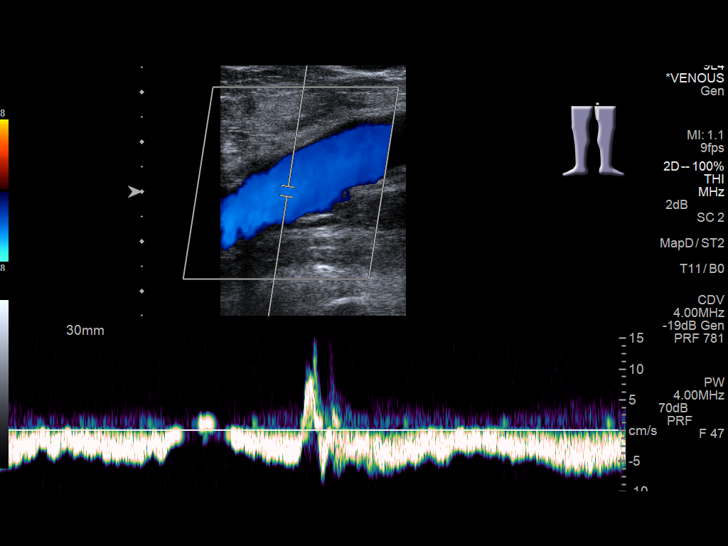
[im 6/31]
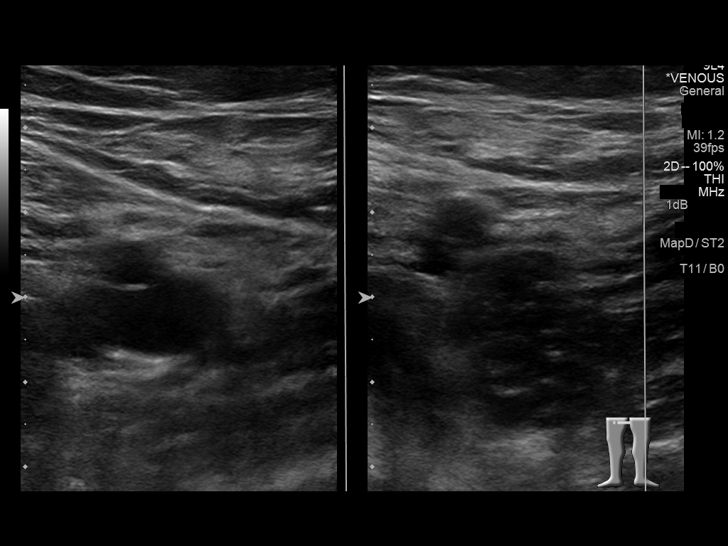
[im 8/31]
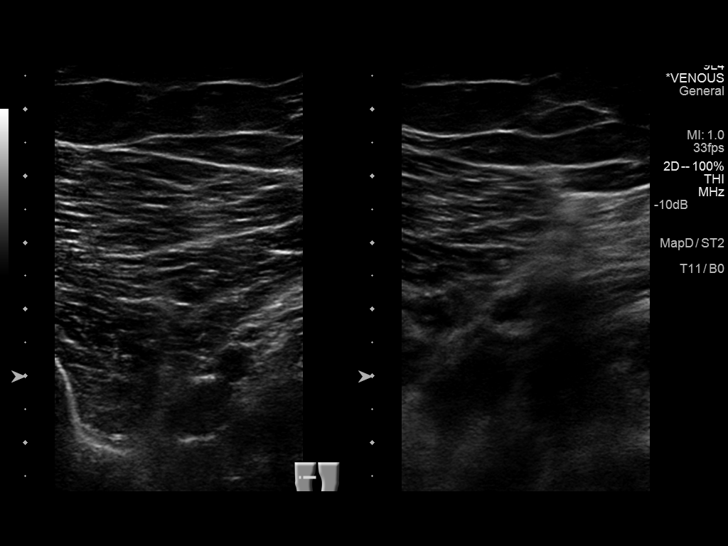
[im 11/31]
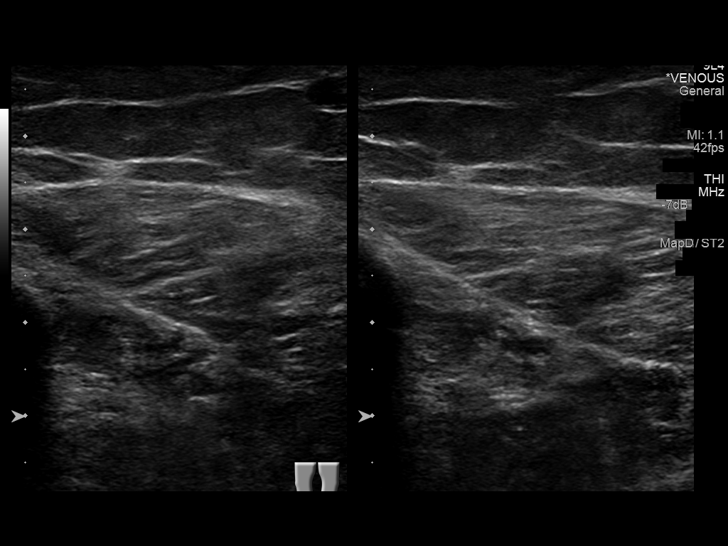
[im 14/31]
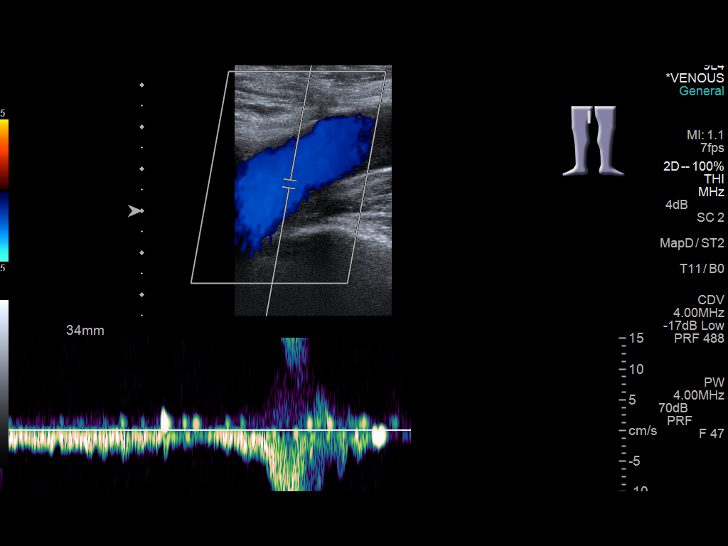
[im 16/31]
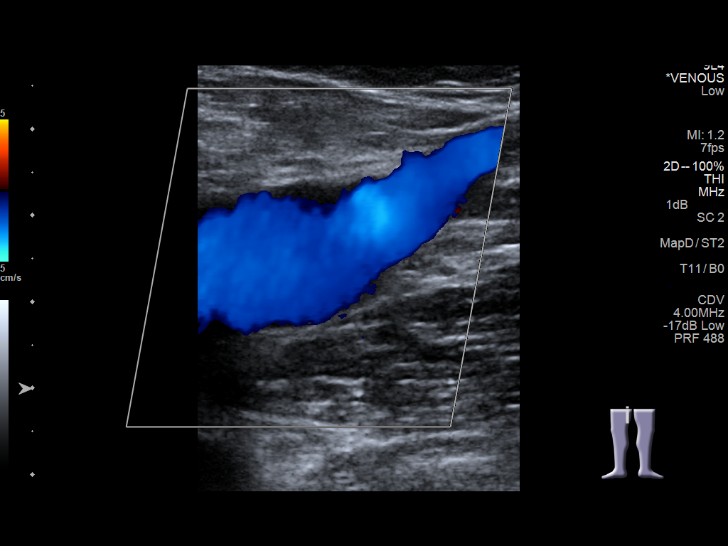
[im 17/31]
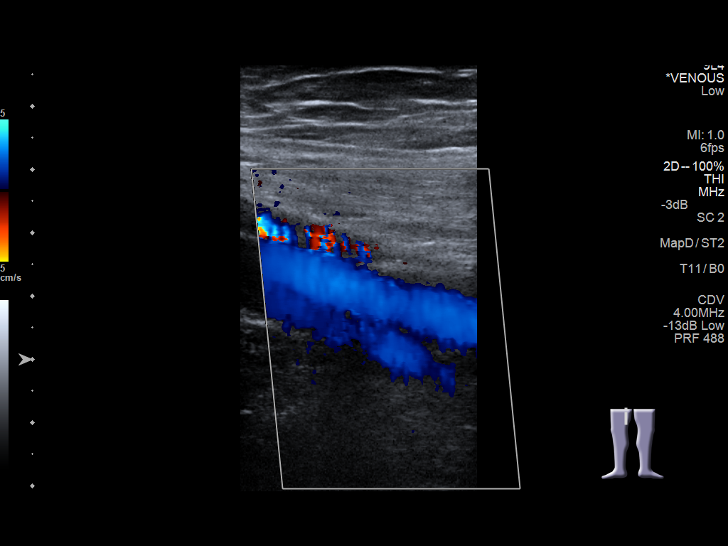
[im 20/31]
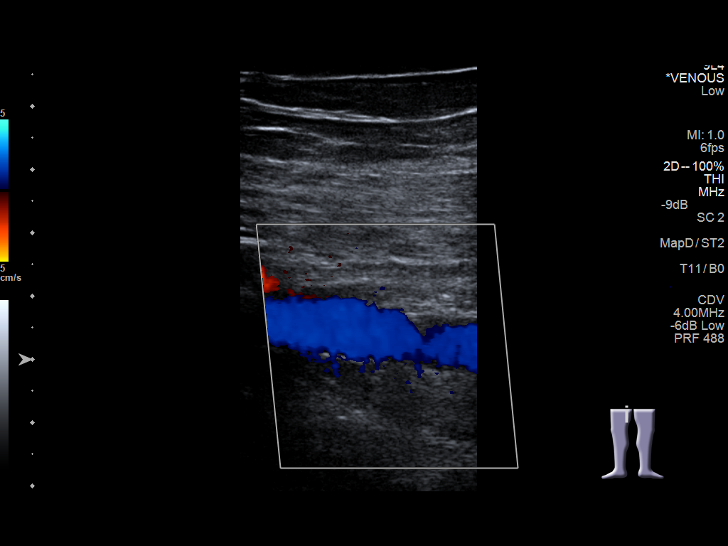
[im 23/31]
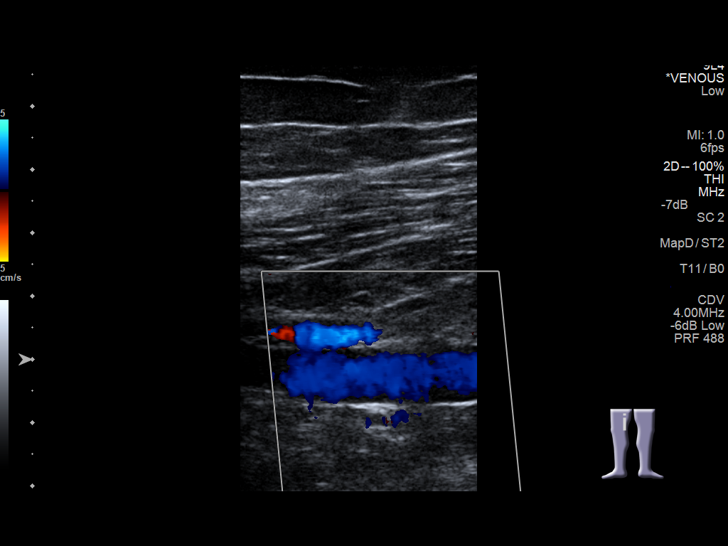
[im 25/31]
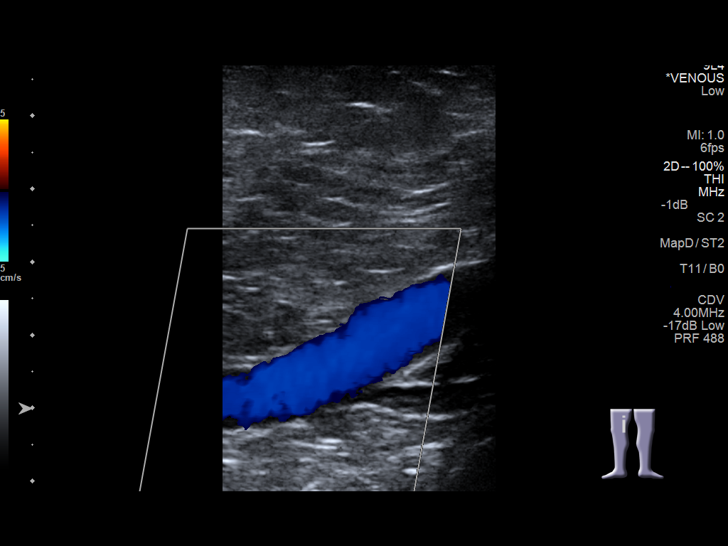
[im 28/31]
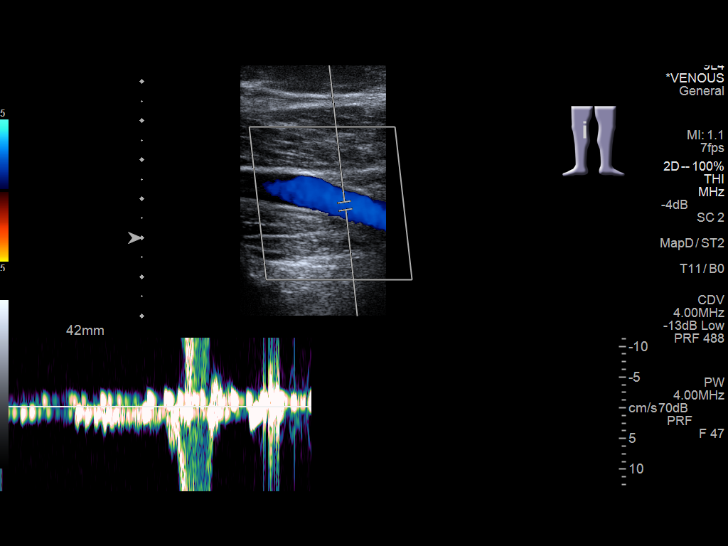
[im 31/31]
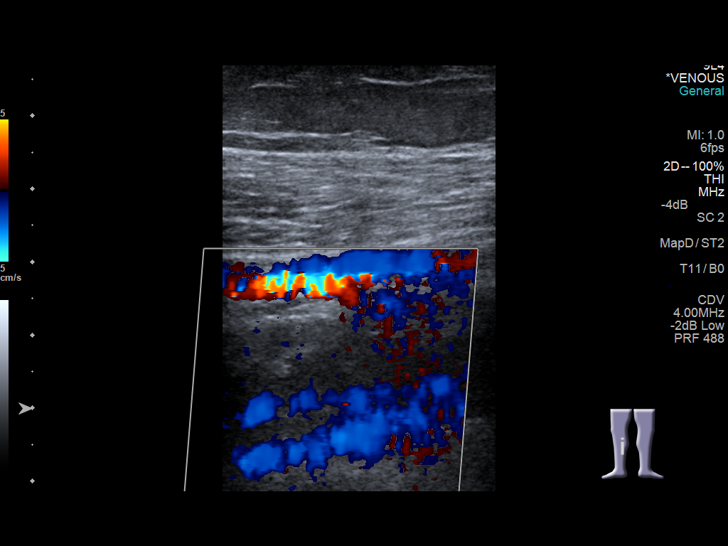

[13 of 24 positions shown; findings below may reference images not displayed]

FINDINGS: Contralateral Common Femoral Vein: Respiratory phasicity is normal
and symmetric with the symptomatic side. No evidence of thrombus.
Normal compressibility.

Common Femoral Vein: No evidence of thrombus. Normal
compressibility, respiratory phasicity and response to augmentation.

Saphenofemoral Junction: No evidence of thrombus. Normal
compressibility and flow on color Doppler imaging.

Profunda Femoral Vein: No evidence of thrombus. Normal
compressibility and flow on color Doppler imaging.

Femoral Vein: No evidence of thrombus. Normal compressibility,
respiratory phasicity and response to augmentation.

Popliteal Vein: No evidence of thrombus. Normal compressibility,
respiratory phasicity and response to augmentation.

Calf Veins: No evidence of thrombus. Normal compressibility and flow
on color Doppler imaging.

Superficial Great Saphenous Vein: No evidence of thrombus. Normal
compressibility and flow on color Doppler imaging.

Venous Reflux:  None.

Other Findings:  None.
IMPRESSION: No evidence of deep venous thrombosis.

## 2017-01-15 ENCOUNTER — Emergency Department: Payer: BLUE CROSS/BLUE SHIELD

## 2017-01-15 ENCOUNTER — Encounter: Payer: Self-pay | Admitting: Emergency Medicine

## 2017-01-15 ENCOUNTER — Emergency Department
Admission: EM | Admit: 2017-01-15 | Discharge: 2017-01-15 | Disposition: A | Discharge status: Home / Self Care | Payer: BLUE CROSS/BLUE SHIELD | Attending: Emergency Medicine | Admitting: Emergency Medicine

## 2017-01-15 DIAGNOSIS — Z79899 Other long term (current) drug therapy: Secondary | ICD-10-CM | POA: Insufficient documentation

## 2017-01-15 DIAGNOSIS — M79604 Pain in right leg: Secondary | ICD-10-CM | POA: Diagnosis not present

## 2017-01-15 DIAGNOSIS — R600 Localized edema: Secondary | ICD-10-CM | POA: Diagnosis not present

## 2017-01-15 DIAGNOSIS — M79661 Pain in right lower leg: Secondary | ICD-10-CM | POA: Diagnosis not present

## 2017-01-15 NOTE — ED Triage Notes (Signed)
R lower leg pain x 3 days. States history of DVT same leg.

## 2017-01-15 NOTE — ED Provider Notes (Signed)
[facility] Emergency Department Provider Note  ____________________________________________   First MD Initiated Contact with Patient 01/15/17 1132     (approximate)  I have reviewed the triage vital signs and the nursing notes.   HISTORY  Chief Complaint Leg Pain   HPI Emily Griffin is a 31 y.o. female with a history of a DVT to the right lower extremity and 2014 was presenting with right lower extremity pain. She says that at the time she was on what sounds like an estrogen containing birth control pill. She said that she did take anticoagulation for 3 months and was stopped from her birth control and has not had any clotting since. However, over the past 3 days she has had a dull pain to the right anterior and medial calf which she says feels like a "bruise." She denies it worsening with movement. Denies any injury or trauma. Denies any long plane trips or car trips lately. Denies any smoking drinking or drug use. Said that she was concerned that she could've had a recurrent blood clot and came in the emergency department for further evaluation.   Past Medical History:  Diagnosis Date  . DVT (deep venous thrombosis) (HCC) 2014   right leg  . STD (female)     Patient Active Problem List   Diagnosis Date Noted  . History of deep vein thrombophlebitis of lower extremity 11/05/2015  . Postpartum care following vaginal delivery 11/04/2015    Past Surgical History:  Procedure Laterality Date  . CERVICAL BIOPSY  W/ LOOP ELECTRODE EXCISION  2009    Prior to Admission medications   Medication Sig Start Date End Date Taking? Authorizing Provider  benzocaine-Menthol (DERMOPLAST) 20-0.5 % AERO Apply 1 application topically as needed for irritation (perineal discomfort). 11/05/15   Farrel Conners, CNM  Cetirizine HCl 10 MG CAPS Take 10 mg by mouth once. Reported on 11/03/2015    Historical Provider, MD  enoxaparin (LOVENOX) 40 MG/0.4ML injection Inject 40  mg into the skin daily.    Historical Provider, MD  Influenza vac split quadrivalent PF (FLUARIX) 0.5 ML injection Inject 0.5 mLs into the muscle tomorrow at 10 am. 11/05/15   Farrel Conners, CNM  lanolin OINT Apply 1 application topically as needed (for breast care). 11/05/15   Farrel Conners, CNM  Prenatal Vit-Fe Fumarate-FA (PRENATAL MULTIVITAMIN) TABS tablet Take 1 tablet by mouth daily at 12 noon.    Historical Provider, MD    Allergies Amoxicillin  Family History  Problem Relation Age of Onset  . Asthma Brother     Social History Social History  Substance Use Topics  . Smoking status: Never Smoker  . Smokeless tobacco: Never Used  . Alcohol use No    Review of Systems Constitutional: No fever/chills Eyes: No visual changes. ENT: No sore throat. Cardiovascular: Denies chest pain. Respiratory: Denies shortness of breath. Gastrointestinal: No abdominal pain.  No nausea, no vomiting.  No diarrhea.  No constipation. Genitourinary: Negative for dysuria. Musculoskeletal: Negative for back pain. Skin: Negative for rash. Neurological: Negative for headaches, focal weakness or numbness.  10-point ROS otherwise negative.  ____________________________________________   PHYSICAL EXAM:  VITAL SIGNS: ED Triage Vitals  Enc Vitals Group     BP 01/15/17 1054 138/90     Pulse Rate 01/15/17 1054 69     Resp 01/15/17 1054 20     Temp 01/15/17 1054 98.1 F (36.7 C)     Temp Source 01/15/17 1054 Oral     SpO2 01/15/17 1054  100 %     Weight 01/15/17 1055 197 lb (89.4 kg)     Height 01/15/17 1055 5\' 5"  (1.651 m)     Head Circumference --      Peak Flow --      Pain Score 01/15/17 1055 2     Pain Loc --      Pain Edu? --      Excl. in GC? --     Constitutional: Alert and oriented. Well appearing and in no acute distress. Eyes: Conjunctivae are normal. PERRL. EOMI. Head: Atraumatic. Nose: No congestion/rhinnorhea. Mouth/Throat: Mucous membranes are moist.   Neck: No  stridor.   Cardiovascular: Normal rate, regular rhythm. Grossly normal heart sounds.  Good peripheral circulation. Respiratory: Normal respiratory effort.  No retractions. Lungs CTAB. Gastrointestinal: Soft and nontender. No distention.  Musculoskeletal: No lower extremity tenderness nor edema.  No joint effusions.  No asymmetry to the bilateral lower extremity is. Dorsalis pedis pulses are present and equal bilaterally. No swelling, erythema, warmth or induration present anywhere to the bilateral lower extremities. Neurologic:  Normal speech and language. No gross focal neurologic deficits are appreciated.  Skin:  Skin is warm, dry and intact. No rash noted. Psychiatric: Mood and affect are normal. Speech and behavior are normal.  ____________________________________________   LABS (all labs ordered are listed, but only abnormal results are displayed)  Labs Reviewed - No data to display ____________________________________________  EKG   ____________________________________________  RADIOLOGY  US Venous Img Lower Unilateral Right (Final result)  Result time 01/15/17 11:49:59  Final result by Norva PavlovElizabeth Brown, MD (01/15/17 11:49:59)           Narrative:   CLINICAL DATA: Right flank pain and edema for 3 days. History of previous DVT.  EXAM: RIGHT LOWER EXTREMITY VENOUS DOPPLER ULTRASOUND  TECHNIQUE: Gray-scale sonography with graded compression, as well as color Doppler and duplex ultrasound were performed to evaluate the lower extremity deep venous systems from the level of the common femoral vein and including the common femoral, femoral, profunda femoral, popliteal and calf veins including the posterior tibial, peroneal and gastrocnemius veins when visible. The superficial great saphenous vein was also interrogated. Spectral Doppler was utilized to evaluate flow at rest and with distal augmentation maneuvers in the common femoral, femoral and popliteal  veins.  COMPARISON: 09/11/2015  FINDINGS: Contralateral Common Femoral Vein: Respiratory phasicity is normal and symmetric with the symptomatic side. No evidence of thrombus. Normal compressibility.  Common Femoral Vein: No evidence of thrombus. Normal compressibility, respiratory phasicity and response to augmentation.  Saphenofemoral Junction: No evidence of thrombus. Normal compressibility and flow on color Doppler imaging.  Profunda Femoral Vein: No evidence of thrombus. Normal compressibility and flow on color Doppler imaging.  Femoral Vein: No evidence of thrombus. Normal compressibility, respiratory phasicity and response to augmentation.  Popliteal Vein: No evidence of thrombus. Normal compressibility, respiratory phasicity and response to augmentation.  Calf Veins: No evidence of thrombus. Normal compressibility and flow on color Doppler imaging.  Superficial Great Saphenous Vein: No evidence of thrombus. Normal compressibility and flow on color Doppler imaging.  Venous Reflux: None.  Other Findings: None.  IMPRESSION: No evidence of deep venous thrombosis.   Electronically Signed By: Norva PavlovElizabeth Brown M.D. On: 01/15/2017 11:49            ____________________________________________   PROCEDURES  Procedure(s) performed:   Procedures  Critical Care performed:   ____________________________________________   INITIAL IMPRESSION / ASSESSMENT AND PLAN / ED COURSE  Pertinent labs & imaging results  that were available during my care of the patient were reviewed by me and considered in my medical decision making (see chart for details).   ----------------------------------------- 12:14 PM on 01/15/2017 -----------------------------------------  Patient with a negative ultrasound of her right lower extremity. Patient to follow-up with primary care. Patient no longer taking estrogen containing birth control. No other risk factors for DVT.  We will try ibuprofen as well as Tylenol as well as salve such as icy hot or Aspercreme. Patient will be discharged home. Discussed the imaging results and also return precautions such as any worsening or concerning symptoms, especially swelling, redness or warmth to lower extremity. The patient is understanding of this plan and willing to comply.     ____________________________________________   FINAL CLINICAL IMPRESSION(S) / ED DIAGNOSES  Calf pain.    NEW MEDICATIONS STARTED DURING THIS VISIT:  New Prescriptions   No medications on file     Note:  This document was prepared using Dragon voice recognition software and may include unintentional dictation errors.    Myrna Blazer, MD 01/15/17 1215

## 2017-09-14 DIAGNOSIS — Z1281 Encounter for screening for malignant neoplasm of oral cavity: Secondary | ICD-10-CM | POA: Diagnosis not present

## 2017-09-14 DIAGNOSIS — M278 Other specified diseases of jaws: Secondary | ICD-10-CM | POA: Diagnosis not present

## 2017-09-14 DIAGNOSIS — J329 Chronic sinusitis, unspecified: Secondary | ICD-10-CM | POA: Diagnosis not present

## 2017-09-14 DIAGNOSIS — K063 Horizontal alveolar bone loss: Secondary | ICD-10-CM | POA: Diagnosis not present

## 2018-03-20 IMAGING — US US EXTREM LOW VENOUS*R*
1 series · 13 of 24 positions shown · non-contrast
Comparison: 09/11/2015

CLINICAL DATA: Right flank pain and edema for 3 days. History of
previous DVT.



[Series 1: us extrem low venous*right* · 0.07mm/px · 13 of 34 slices shown]
[im 1/34]
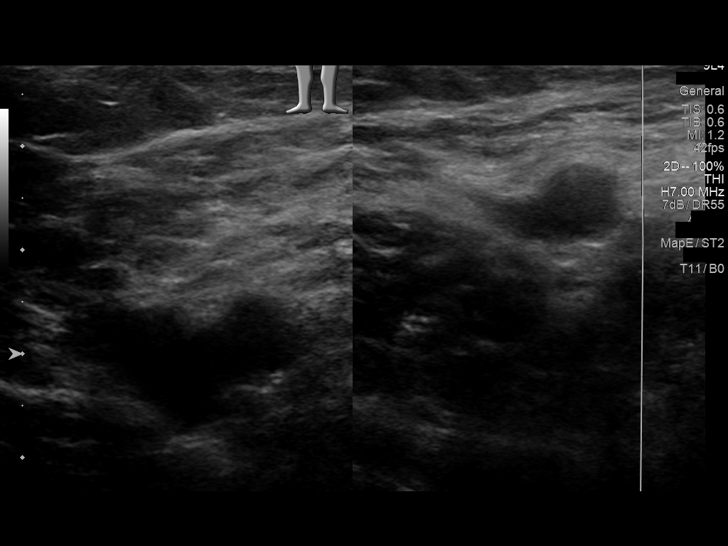
[im 3/34]
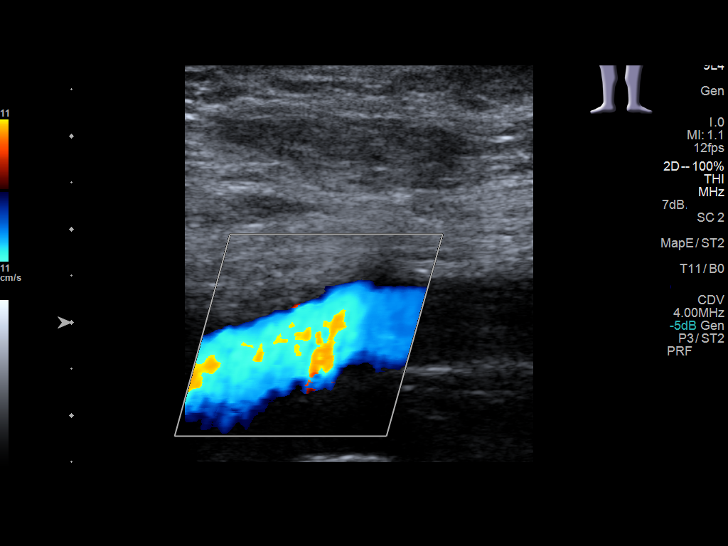
[im 6/34]
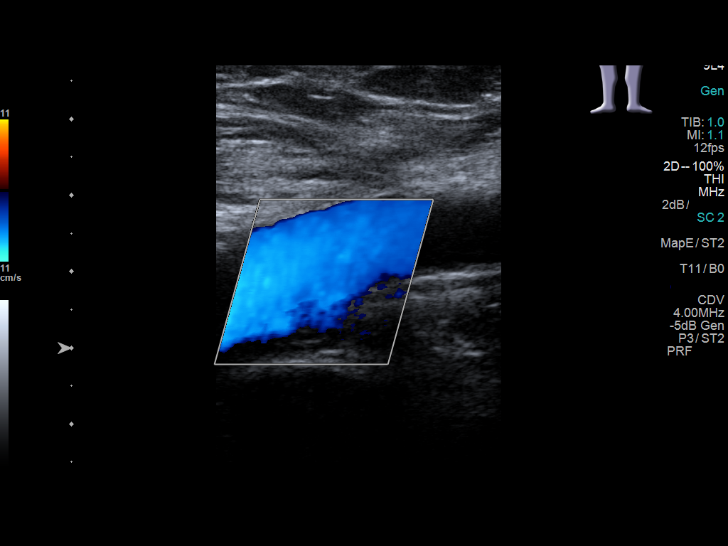
[im 9/34]
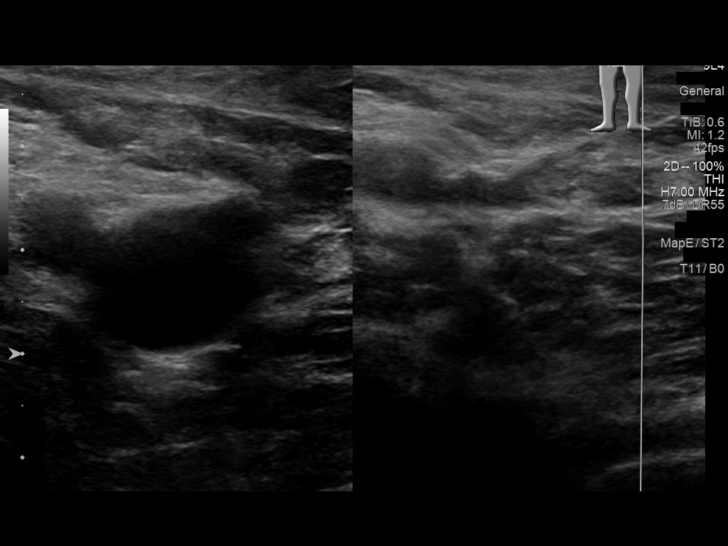
[im 12/34]
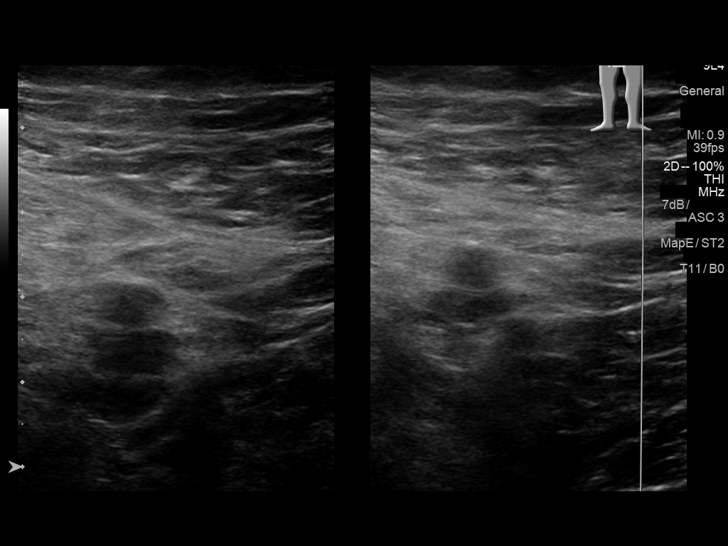
[im 15/34]
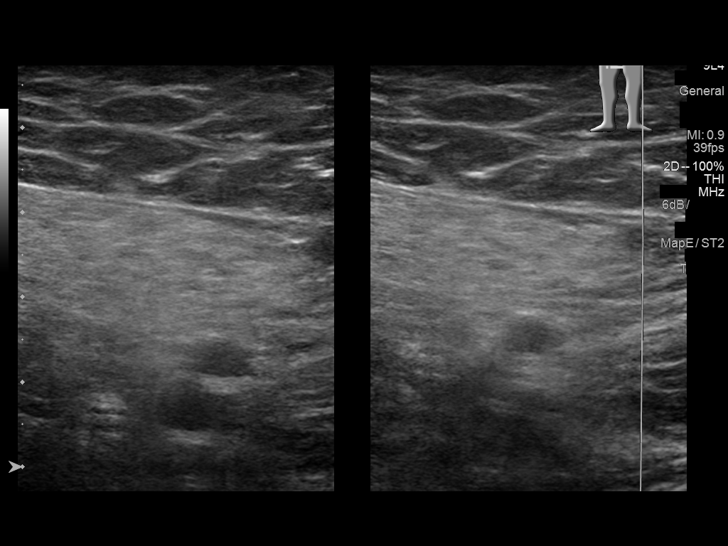
[im 18/34]
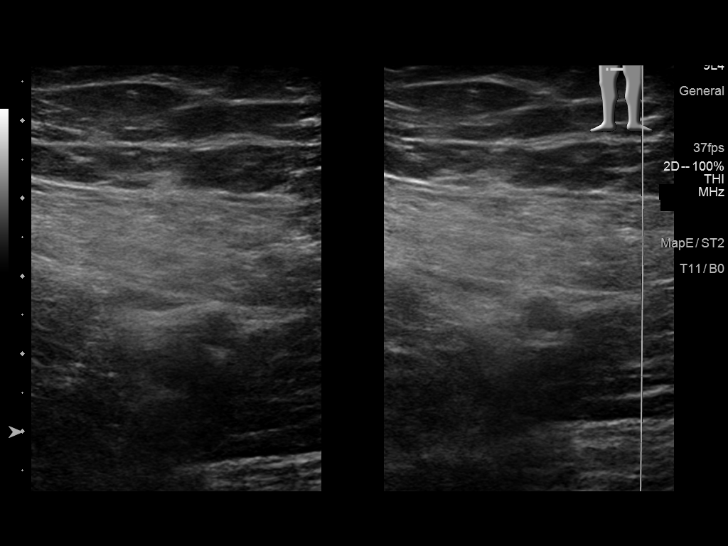
[im 19/34]
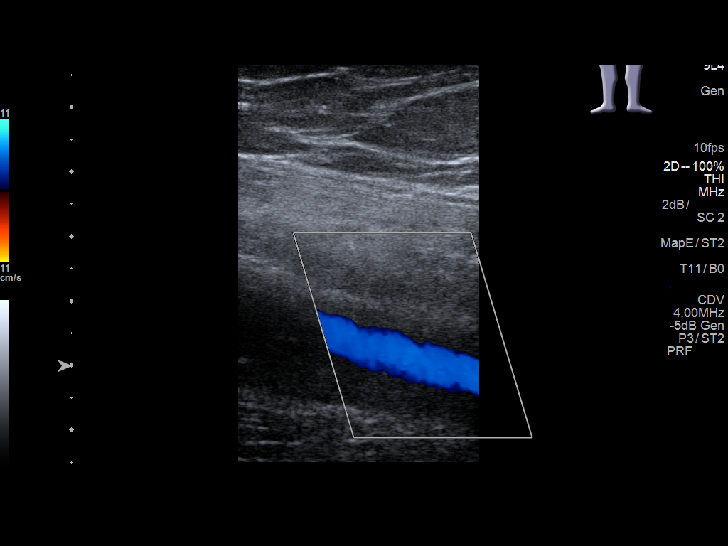
[im 22/34]
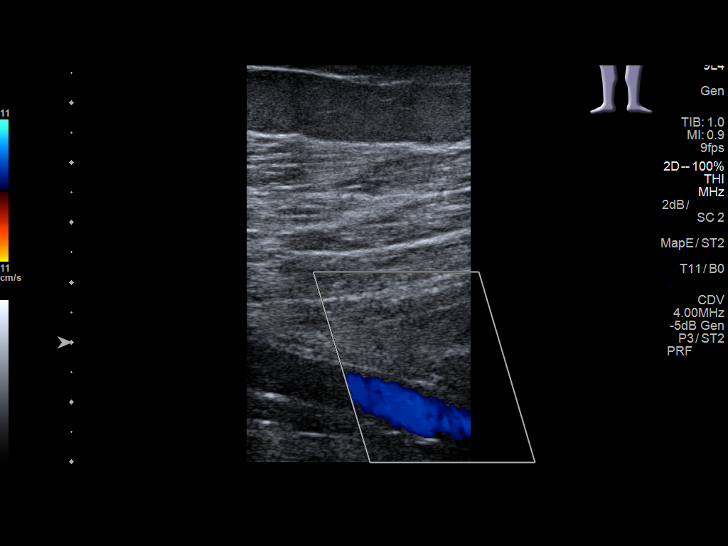
[im 25/34]
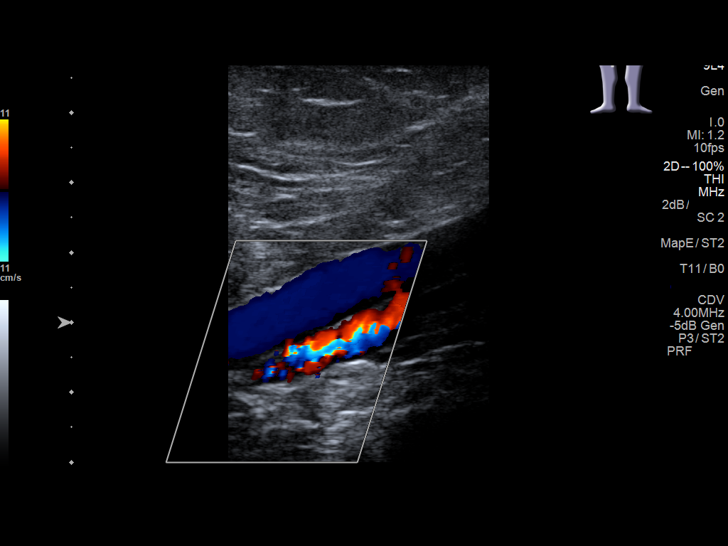
[im 28/34]
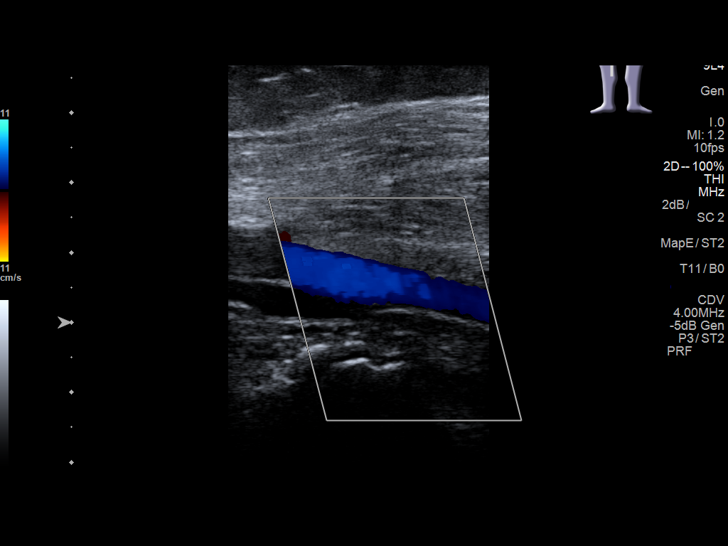
[im 31/34]
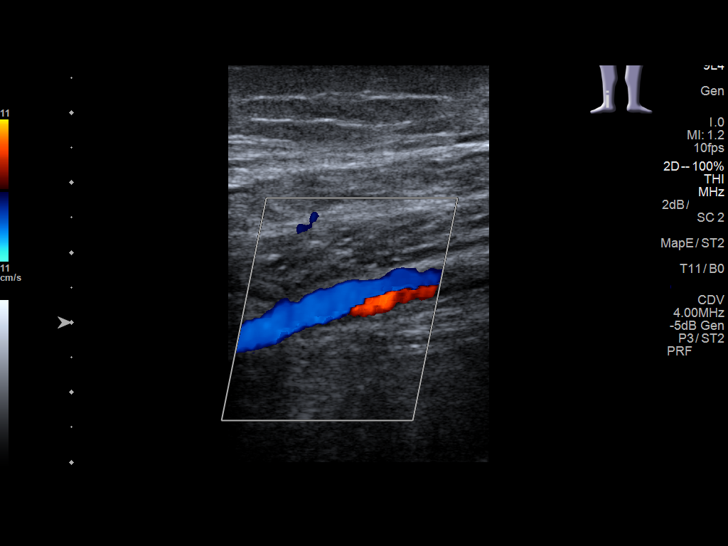
[im 34/34]
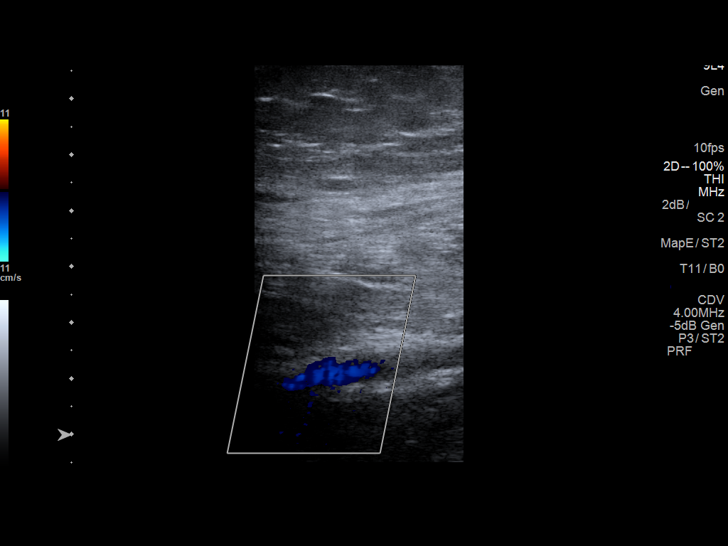

[13 of 24 positions shown; findings below may reference images not displayed]

FINDINGS: Contralateral Common Femoral Vein: Respiratory phasicity is normal
and symmetric with the symptomatic side. No evidence of thrombus.
Normal compressibility.

Common Femoral Vein: No evidence of thrombus. Normal
compressibility, respiratory phasicity and response to augmentation.

Saphenofemoral Junction: No evidence of thrombus. Normal
compressibility and flow on color Doppler imaging.

Profunda Femoral Vein: No evidence of thrombus. Normal
compressibility and flow on color Doppler imaging.

Femoral Vein: No evidence of thrombus. Normal compressibility,
respiratory phasicity and response to augmentation.

Popliteal Vein: No evidence of thrombus. Normal compressibility,
respiratory phasicity and response to augmentation.

Calf Veins: No evidence of thrombus. Normal compressibility and flow
on color Doppler imaging.

Superficial Great Saphenous Vein: No evidence of thrombus. Normal
compressibility and flow on color Doppler imaging.

Venous Reflux:  None.

Other Findings:  None.
IMPRESSION: No evidence of deep venous thrombosis.

## 2018-08-21 ENCOUNTER — Telehealth: Payer: Self-pay

## 2018-08-21 NOTE — Telephone Encounter (Signed)
Np paper work mailed out on 10/07 ry

## 2018-08-21 NOTE — Telephone Encounter (Signed)
Copied from CRM [id number]. Topic: Appointment Scheduling - New Patient >> Aug 21, 2018  3:23 PM Leafy Ro wrote: New patient has been scheduled for your office. Provider: [email or web address] Date of Appointment: 09-28-18 Route to department's PEC pool.

## 2018-09-04 DIAGNOSIS — S8391XA Sprain of unspecified site of right knee, initial encounter: Secondary | ICD-10-CM | POA: Diagnosis not present

## 2018-09-11 DIAGNOSIS — M25561 Pain in right knee: Secondary | ICD-10-CM | POA: Diagnosis not present

## 2018-09-11 DIAGNOSIS — M222X1 Patellofemoral disorders, right knee: Secondary | ICD-10-CM | POA: Diagnosis not present

## 2018-09-11 DIAGNOSIS — R600 Localized edema: Secondary | ICD-10-CM | POA: Diagnosis not present

## 2018-09-28 ENCOUNTER — Ambulatory Visit (INDEPENDENT_AMBULATORY_CARE_PROVIDER_SITE_OTHER): Payer: BLUE CROSS/BLUE SHIELD | Admitting: Internal Medicine

## 2018-09-28 ENCOUNTER — Ambulatory Visit
Admission: RE | Admit: 2018-09-28 | Discharge: 2018-09-28 | Disposition: A | Discharge status: Home / Self Care | Payer: BLUE CROSS/BLUE SHIELD | Source: Ambulatory Visit | Attending: Internal Medicine | Admitting: Internal Medicine

## 2018-09-28 ENCOUNTER — Encounter: Payer: Self-pay | Admitting: Internal Medicine

## 2018-09-28 ENCOUNTER — Ambulatory Visit: Payer: BLUE CROSS/BLUE SHIELD | Admitting: Internal Medicine

## 2018-09-28 VITALS — BP 116/78 | HR 73 | Temp 97.9°F | Ht 65.0 in | Wt 199.4 lb

## 2018-09-28 DIAGNOSIS — Z8672 Personal history of thrombophlebitis: Secondary | ICD-10-CM

## 2018-09-28 DIAGNOSIS — Z0000 Encounter for general adult medical examination without abnormal findings: Secondary | ICD-10-CM

## 2018-09-28 DIAGNOSIS — Z23 Encounter for immunization: Secondary | ICD-10-CM | POA: Diagnosis not present

## 2018-09-28 DIAGNOSIS — R600 Localized edema: Secondary | ICD-10-CM | POA: Insufficient documentation

## 2018-09-28 DIAGNOSIS — Z1389 Encounter for screening for other disorder: Secondary | ICD-10-CM

## 2018-09-28 DIAGNOSIS — Z1329 Encounter for screening for other suspected endocrine disorder: Secondary | ICD-10-CM | POA: Diagnosis not present

## 2018-09-28 DIAGNOSIS — E559 Vitamin D deficiency, unspecified: Secondary | ICD-10-CM

## 2018-09-28 DIAGNOSIS — Z13220 Encounter for screening for lipoid disorders: Secondary | ICD-10-CM | POA: Diagnosis not present

## 2018-09-28 HISTORY — DX: Localized edema: R60.0

## 2018-09-28 HISTORY — DX: Vitamin D deficiency, unspecified: E55.9

## 2018-09-28 LAB — LIPID PANEL
Cholesterol: 143 mg/dL (ref 0–200)
HDL: 44.1 mg/dL (ref >39.00)
LDL Cholesterol: 92 mg/dL (ref 0–99)
NonHDL: 98.91
TRIGLYCERIDES: 34 mg/dL (ref 0.0–149.0)
Total CHOL/HDL Ratio: 3
VLDL: 6.8 mg/dL (ref 0.0–40.0)

## 2018-09-28 LAB — CBC WITH DIFFERENTIAL/PLATELET
Basophils Absolute: 0 10*3/uL (ref 0.0–0.1)
Basophils Relative: 0.6 % (ref 0.0–3.0)
EOS ABS: 0.2 10*3/uL (ref 0.0–0.7)
Eosinophils Relative: 3.6 % (ref 0.0–5.0)
HEMATOCRIT: 39.5 % (ref 36.0–46.0)
HEMOGLOBIN: 13.5 g/dL (ref 12.0–15.0)
LYMPHS PCT: 39.6 % (ref 12.0–46.0)
Lymphs Abs: 1.9 10*3/uL (ref 0.7–4.0)
MCHC: 34.2 g/dL (ref 30.0–36.0)
MCV: 93 fl (ref 78.0–100.0)
MONOS PCT: 8.2 % (ref 3.0–12.0)
Monocytes Absolute: 0.4 10*3/uL (ref 0.1–1.0)
Neutro Abs: 2.3 10*3/uL (ref 1.4–7.7)
Neutrophils Relative %: 48 % (ref 43.0–77.0)
Platelets: 207 10*3/uL (ref 150.0–400.0)
RBC: 4.25 Mil/uL (ref 3.87–5.11)
RDW: 13.6 % (ref 11.5–15.5)
WBC: 4.8 10*3/uL (ref 4.0–10.5)

## 2018-09-28 LAB — COMPREHENSIVE METABOLIC PANEL
ALT: 9 U/L (ref 0–35)
AST: 13 U/L (ref 0–37)
Albumin: 4.2 g/dL (ref 3.5–5.2)
Alkaline Phosphatase: 59 U/L (ref 39–117)
BILIRUBIN TOTAL: 0.7 mg/dL (ref 0.2–1.2)
BUN: 16 mg/dL (ref 6–23)
CALCIUM: 9.4 mg/dL (ref 8.4–10.5)
CHLORIDE: 105 meq/L (ref 96–112)
CO2: 26 mEq/L (ref 19–32)
CREATININE: 0.68 mg/dL (ref 0.40–1.20)
GFR: 127.44 mL/min (ref >60.00)
Glucose, Bld: 84 mg/dL (ref 70–99)
Potassium: 4 mEq/L (ref 3.5–5.1)
Sodium: 137 mEq/L (ref 135–145)
Total Protein: 7.1 g/dL (ref 6.0–8.3)

## 2018-09-28 LAB — VITAMIN D 25 HYDROXY (VIT D DEFICIENCY, FRACTURES): VITD: 22.24 ng/mL — AB (ref 30.00–100.00)

## 2018-09-28 LAB — T4, FREE: FREE T4: 0.82 ng/dL (ref 0.60–1.60)

## 2018-09-28 LAB — TSH: TSH: 0.67 u[IU]/mL (ref 0.35–4.50)

## 2018-09-28 NOTE — Progress Notes (Signed)
Chief Complaint  Patient presents with  . Establish Care   1. Chronic leg edema R>L DVT right 2014 with chronic leg edema took Xarelto x 3 months prior was on OCP at time of DVT now has IUD. C/o legs swelling/achy b/l esp at the end of the day and c/w history this could be DVT, brother has lymphedema and mom with leg swelling She is not wearing compression stockings   Review of Systems  Constitutional: Negative for weight loss.  HENT: Negative for hearing loss.   Eyes: Negative for blurred vision.  Respiratory: Negative for shortness of breath.   Cardiovascular: Positive for leg swelling. Negative for chest pain.  Gastrointestinal: Negative for abdominal pain.  Musculoskeletal: Negative for joint pain.  Skin: Negative for rash.  Neurological: Negative for headaches.  Psychiatric/Behavioral: Negative for depression.   Past Medical History:  Diagnosis Date  . DVT (deep venous thrombosis) (HCC) 2014   right leg  . STD (female)    Past Surgical History:  Procedure Laterality Date  . CERVICAL BIOPSY  W/ LOOP ELECTRODE EXCISION  2009   Family History  Problem Relation Age of Onset  . Asthma Brother    Social History   Socioeconomic History  . Marital status: Single    Spouse name: Not on file  . Number of children: Not on file  . Years of education: Not on file  . Highest education level: Not on file  Occupational History  . Not on file  Social Needs  . Financial resource strain: Not on file  . Food insecurity:    Worry: Not on file    Inability: Not on file  . Transportation needs:    Medical: Not on file    Non-medical: Not on file  Tobacco Use  . Smoking status: Never Smoker  . Smokeless tobacco: Never Used  Substance and Sexual Activity  . Alcohol use: No    Alcohol/week: 0.0 standard drinks  . Drug use: No  . Sexual activity: Yes  Lifestyle  . Physical activity:    Days per week: Not on file    Minutes per session: Not on file  . Stress: Not on file   Relationships  . Social connections:    Talks on phone: Not on file    Gets together: Not on file    Attends religious service: Not on file    Active member of club or organization: Not on file    Attends meetings of clubs or organizations: Not on file    Relationship status: Not on file  . Intimate partner violence:    Fear of current or ex partner: Not on file    Emotionally abused: Not on file    Physically abused: Not on file    Forced sexual activity: Not on file  Other Topics Concern  . Not on file  Social History Narrative  . Not on file   Current Meds  Medication Sig  . Cetirizine HCl 10 MG CAPS Take 10 mg by mouth as needed. Reported on 11/03/2015   Allergies  Allergen Reactions  . Amoxicillin Rash   No results found for this or any previous visit (from the past 2160 hour(s)). Objective  Body mass index is 33.18 kg/m. Wt Readings from Last 3 Encounters:  09/28/18 199 lb 6.4 oz (90.4 kg)  01/15/17 197 lb (89.4 kg)  11/03/15 210 lb (95.3 kg)   Temp Readings from Last 3 Encounters:  09/28/18 97.9 F (36.6 C) (Oral)  01/15/17 98.1  F (36.7 C) (Oral)  11/05/15 98 F (36.7 C) (Oral)   BP Readings from Last 3 Encounters:  09/28/18 116/78  01/15/17 120/78  11/05/15 106/66   Pulse Readings from Last 3 Encounters:  09/28/18 73  01/15/17 60  11/05/15 75    Physical Exam  Constitutional: She is oriented to person, place, and time. Vital signs are normal. She appears well-developed and well-nourished. She is cooperative.  HENT:  Head: Normocephalic and atraumatic.  Mouth/Throat: Oropharynx is clear and moist and mucous membranes are normal.  Eyes: Pupils are equal, round, and reactive to light. Conjunctivae are normal.  Cardiovascular: Normal rate, regular rhythm and normal heart sounds.  Chronic lymphedema   Pulmonary/Chest: Effort normal and breath sounds normal.  Neurological: She is alert and oriented to person, place, and time. Gait normal.  Skin:  Skin is warm, dry and intact.  Psychiatric: She has a normal mood and affect. Her speech is normal and behavior is normal. Judgment and thought content normal. Cognition and memory are normal.  Nursing note and vitals reviewed.   Assessment   1 chronic leg edema r/o DVT h/o right DVT right leg edema>left could be chronic venous insuff. 2/2 prior DVT H/o DVT 2014 right was on xarelto x 3 months and OCP prior to DVT  2. HM Plan   1. B/l US r/o DVT  Compression stockings Rx knee highs 15-20 mmHG if not helpful increase 20-30 likely chronic edema right leg s/p DVT Disc reduce salt intake, leg edema  2.  Flu shot given today Tdap in future  Declines STD check today h/o HPV and HSV since 20s  Fasting labs today Pap OB/GYN Dr. Tiburcio PeaHarris sch 10/2018 s/p LEEP   Provider: Dr. French Anaracy McLean-Scocuzza-Internal Medicine

## 2018-09-28 NOTE — Patient Instructions (Addendum)
claritin or allegra or Xyzal at night  Try compression stockings f/u in 3 months sooner if needed   Allergic Rhinitis, Adult Allergic rhinitis is an allergic reaction that affects the mucous membrane inside the nose. It causes sneezing, a runny or stuffy nose, and the feeling of mucus going down the back of the throat (postnasal drip). Allergic rhinitis can be mild to severe. There are two types of allergic rhinitis:  Seasonal. This type is also called hay fever. It happens only during certain seasons.  Perennial. This type can happen at any time of the year.  What are the causes? This condition happens when the body's defense system (immune system) responds to certain harmless substances called allergens as though they were germs.  Seasonal allergic rhinitis is triggered by pollen, which can come from grasses, trees, and weeds. Perennial allergic rhinitis may be caused by:  House dust mites.  Pet dander.  Mold spores.  What are the signs or symptoms? Symptoms of this condition include:  Sneezing.  Runny or stuffy nose (nasal congestion).  Postnasal drip.  Itchy nose.  Tearing of the eyes.  Trouble sleeping.  Daytime sleepiness.  How is this diagnosed? This condition may be diagnosed based on:  Your medical history.  A physical exam.  Tests to check for related conditions, such as: ? Asthma. ? Pink eye. ? Ear infection. ? Upper respiratory infection.  Tests to find out which allergens trigger your symptoms. These may include skin or blood tests.  How is this treated? There is no cure for this condition, but treatment can help control symptoms. Treatment may include:  Taking medicines that block allergy symptoms, such as antihistamines. Medicine may be given as a shot, nasal spray, or pill.  Avoiding the allergen.  Desensitization. This treatment involves getting ongoing shots until your body becomes less sensitive to the allergen. This treatment may be done  if other treatments do not help.  If taking medicine and avoiding the allergen does not work, new, stronger medicines may be prescribed.  Follow these instructions at home:  Find out what you are allergic to. Common allergens include smoke, dust, and pollen.  Avoid the things you are allergic to. These are some things you can do to help avoid allergens: ? Replace carpet with wood, tile, or vinyl flooring. Carpet can trap dander and dust. ? Do not smoke. Do not allow smoking in your home. ? Change your heating and air conditioning filter at least once a month. ? During allergy season:  Keep windows closed as much as possible.  Plan outdoor activities when pollen counts are lowest. This is usually during the evening hours.  When coming indoors, change clothing and shower before sitting on furniture or bedding.  Take over-the-counter and prescription medicines only as told by your health care provider.  Keep all follow-up visits as told by your health care provider. This is important. Contact a health care provider if:  You have a fever.  You develop a persistent cough.  You make whistling sounds when you breathe (you wheeze).  Your symptoms interfere with your normal daily activities. Get help right away if:  You have shortness of breath. Summary  This condition can be managed by taking medicines as directed and avoiding allergens.  Contact your health care provider if you develop a persistent cough or fever.  During allergy season, keep windows closed as much as possible. This information is not intended to replace advice given to you by your health care  provider. Make sure you discuss any questions you have with your health care provider. Document Released: 07/27/2001 Document Revised: 12/09/2016 Document Reviewed: 12/09/2016 Elsevier Interactive Patient Education  2018 [employer]  Edema Edema is an abnormal buildup of fluids in your bodytissues. Edema is  somewhatdependent on gravity to pull the fluid to the lowest place in your body. That makes the condition more common in the legs and thighs (lower extremities). Painless swelling of the feet and ankles is common and becomes more likely as you get older. It is also common in looser tissues, like around your eyes. When the affected area is squeezed, the fluid may move out of that spot and leave a dent for a few moments. This dent is called pitting. What are the causes? There are many possible causes of edema. Eating too much salt and being on your feet or sitting for a long time can cause edema in your legs and ankles. Hot weather may make edema worse. Common medical causes of edema include:  Heart failure.  Liver disease.  Kidney disease.  Weak blood vessels in your legs.  Cancer.  An injury.  Pregnancy.  Some medications.  Obesity.  What are the signs or symptoms? Edema is usually painless.Your skin may look swollen or shiny. How is this diagnosed? Your health care provider may be able to diagnose edema by asking about your medical history and doing a physical exam. You may need to have tests such as X-rays, an electrocardiogram, or blood tests to check for medical conditions that may cause edema. How is this treated? Edema treatment depends on the cause. If you have heart, liver, or kidney disease, you need the treatment appropriate for these conditions. General treatment may include:  Elevation of the affected body part above the level of your heart.  Compression of the affected body part. Pressure from elastic bandages or support stockings squeezes the tissues and forces fluid back into the blood vessels. This keeps fluid from entering the tissues.  Restriction of fluid and salt intake.  Use of a water pill (diuretic). These medications are appropriate only for some types of edema. They pull fluid out of your body and make you urinate more often. This gets rid of fluid and  reduces swelling, but diuretics can have side effects. Only use diuretics as directed by your health care provider.  Follow these instructions at home:  Keep the affected body part above the level of your heart when you are lying down.  Do not sit still or stand for prolonged periods.  Do not put anything directly under your knees when lying down.  Do not wear constricting clothing or garters on your upper legs.  Exercise your legs to work the fluid back into your blood vessels. This may help the swelling go down.  Wear elastic bandages or support stockings to reduce ankle swelling as directed by your health care provider.  Eat a low-salt diet to reduce fluid if your health care provider recommends it.  Only take medicines as directed by your health care provider. Contact a health care provider if:  Your edema is not responding to treatment.  You have heart, liver, or kidney disease and notice symptoms of edema.  You have edema in your legs that does not improve after elevating them.  You have sudden and unexplained weight gain. Get help right away if:  You develop shortness of breath or chest pain.  You cannot breathe when you lie down.  You develop pain,  redness, or warmth in the swollen areas.  You have heart, liver, or kidney disease and suddenly get edema.  You have a fever and your symptoms suddenly get worse. This information is not intended to replace advice given to you by your health care provider. Make sure you discuss any questions you have with your health care provider. Document Released: 11/01/2005 Document Revised: 04/08/2016 Document Reviewed: 08/24/2013 Elsevier Interactive Patient Education  2017 [employer].

## 2018-09-28 NOTE — Progress Notes (Signed)
Pre visit review using our clinic review tool, if applicable. No additional management support is needed unless otherwise documented below in the visit note. 

## 2018-09-29 LAB — URINALYSIS, ROUTINE W REFLEX MICROSCOPIC
BILIRUBIN URINE: NEGATIVE
GLUCOSE, UA: NEGATIVE
Hgb urine dipstick: NEGATIVE
Ketones, ur: NEGATIVE
Leukocytes, UA: NEGATIVE
Nitrite: NEGATIVE
PROTEIN: NEGATIVE
SPECIFIC GRAVITY, URINE: 1.025 (ref 1.001–1.03)
pH: 6 (ref 5.0–8.0)

## 2018-10-04 DIAGNOSIS — S8391XA Sprain of unspecified site of right knee, initial encounter: Secondary | ICD-10-CM | POA: Diagnosis not present

## 2018-10-26 ENCOUNTER — Telehealth: Payer: Self-pay | Admitting: Internal Medicine

## 2018-10-26 ENCOUNTER — Other Ambulatory Visit: Payer: Self-pay | Admitting: Internal Medicine

## 2018-10-26 DIAGNOSIS — E559 Vitamin D deficiency, unspecified: Secondary | ICD-10-CM

## 2018-10-26 MED ORDER — CHOLECALCIFEROL 1.25 MG (50000 UT) PO CAPS: 50000.0000 [IU] | ORAL_CAPSULE | ORAL | 1 refills | Status: DC

## 2018-10-26 NOTE — Telephone Encounter (Signed)
Labs 10/24/18 [facility]  Normal liver kidneys  Cholesterol  TC 152, TG 33, HDL 45, LDL 100 #s ok HDL low for woman goal 50 -mail cholesterol handout, rec healthy diet and exercise   Thyroid labs normal  Blood cts normal 4.3/13.5/39.3/209   Vitamin D low 20.3  -Rx high dose D3 weekly x 6 months then take 5000 IU D3 daily month 7 otc   TMS

## 2018-10-27 NOTE — Telephone Encounter (Signed)
Patient was informed of results.  Patient understood and no questions, comments, or concerns at this time.  

## 2018-10-30 ENCOUNTER — Encounter: Payer: Self-pay | Admitting: Obstetrics & Gynecology

## 2018-10-30 ENCOUNTER — Ambulatory Visit (INDEPENDENT_AMBULATORY_CARE_PROVIDER_SITE_OTHER): Payer: BLUE CROSS/BLUE SHIELD | Admitting: Obstetrics & Gynecology

## 2018-10-30 ENCOUNTER — Other Ambulatory Visit (HOSPITAL_COMMUNITY)
Admission: RE | Admit: 2018-10-30 | Discharge: 2018-10-30 | Disposition: A | Discharge status: Home / Self Care | Payer: BLUE CROSS/BLUE SHIELD | Source: Ambulatory Visit | Attending: Obstetrics & Gynecology | Admitting: Obstetrics & Gynecology

## 2018-10-30 VITALS — BP 100/70 | Ht 65.0 in | Wt 197.0 lb

## 2018-10-30 DIAGNOSIS — Z01419 Encounter for gynecological examination (general) (routine) without abnormal findings: Secondary | ICD-10-CM | POA: Diagnosis not present

## 2018-10-30 DIAGNOSIS — Z124 Encounter for screening for malignant neoplasm of cervix: Secondary | ICD-10-CM | POA: Insufficient documentation

## 2018-10-30 DIAGNOSIS — Z0000 Encounter for general adult medical examination without abnormal findings: Secondary | ICD-10-CM

## 2018-10-30 NOTE — Progress Notes (Signed)
HPI:      [name] is a 32 y.o. G2P1001 who LMP was Patient's last menstrual period was 10/27/2018., she presents today for her annual examination. The patient has no complaints today. The patient is not currently sexually active. Her last pap: approximate date 2016 and was normal and she had LEEP for dysplasia in 2009. The patient does perform self breast exams.  There is no notable family history of breast or ovarian cancer in her family.  The patient has regular exercise: yes.  The patient denies current symptoms of depression.    GYN History: Contraception: IUD, Liletta, with monthly discharge (brown) for 3 weeks, min bleeding  PMHx: Past Medical History:  Diagnosis Date  . Abnormal Pap smear of cervix   . DVT (deep venous thrombosis) (HCC) 2014   right leg was on OCP  . Migraine    in college   . STD (female)    HSV, HPV   Past Surgical History:  Procedure Laterality Date  . CERVICAL BIOPSY  W/ LOOP ELECTRODE EXCISION  2009   Family History  Problem Relation Age of Onset  . Miscarriages / [place] Mother   . Edema Mother   . Hypertension Father   . Asthma Brother   . Edema Brother        lymphedema  . Peripheral Artery Disease Maternal Grandmother        with stent  . Alcohol abuse Maternal Grandfather   . Cancer Maternal Grandfather        lung cancer mets smoker  . Alcohol abuse Paternal Grandfather   . Cancer Paternal Grandfather        pancreatitc   . Hypertension Paternal Grandfather    Social History   Tobacco Use  . Smoking status: Never Smoker  . Smokeless tobacco: Never Used  Substance Use Topics  . Alcohol use: No    Alcohol/week: 0.0 standard drinks  . Drug use: No    Current Outpatient Medications:  .  Cetirizine HCl 10 MG CAPS, Take 10 mg by mouth as needed. Reported on 11/03/2015, Disp: , Rfl:  .  Cholecalciferol 1.25 MG (50000 UT) capsule, Take 1 capsule (50,000 Units total) by mouth once a week. (Patient not taking: Reported on  10/30/2018), Disp: 13 capsule, Rfl: 1 .  fluticasone (FLONASE) 50 MCG/ACT nasal spray, Place into both nostrils daily., Disp: , Rfl:  Allergies: Amoxicillin  Review of Systems  Constitutional: Negative for chills, fever and malaise/fatigue.  HENT: Negative for congestion, sinus pain and sore throat.   Eyes: Negative for blurred vision and pain.  Respiratory: Negative for cough and wheezing.   Cardiovascular: Negative for chest pain and leg swelling.  Gastrointestinal: Negative for abdominal pain, constipation, diarrhea, heartburn, nausea and vomiting.  Genitourinary: Negative for dysuria, frequency, hematuria and urgency.  Musculoskeletal: Negative for back pain, joint pain, myalgias and neck pain.  Skin: Negative for itching and rash.  Neurological: Negative for dizziness, tremors and weakness.  Endo/Heme/Allergies: Does not bruise/bleed easily.  Psychiatric/Behavioral: Negative for depression. The patient is not nervous/anxious and does not have insomnia.     Objective: BP 100/70   Ht 5\' 5"  (1.651 m)   Wt 197 lb (89.4 kg)   LMP 10/27/2018   BMI 32.78 kg/m   Filed Weights   10/30/18 1432  Weight: 197 lb (89.4 kg)   Body mass index is 32.78 kg/m. Physical Exam Constitutional:      General: She is not in acute distress.  Appearance: She is well-developed.  Genitourinary:     Pelvic exam was performed with patient supine.     Vagina, uterus and rectum normal.     No lesions in the vagina.     No vaginal bleeding.     No cervical motion tenderness, friability, lesion or polyp.     Uterus is mobile.     Uterus is not enlarged.     No uterine mass detected.    Uterus is midaxial.     No right or left adnexal mass present.     Right adnexa not tender.     Left adnexa not tender.     Genitourinary Comments: IUD string 2 cm  HENT:     Head: Normocephalic and atraumatic. No laceration.     Right Ear: Hearing normal.     Left Ear: Hearing normal.     Mouth/Throat:      Pharynx: Uvula midline.  Eyes:     Pupils: Pupils are equal, round, and reactive to light.  Neck:     Musculoskeletal: Normal range of motion and neck supple.     Thyroid: No thyromegaly.  Cardiovascular:     Rate and Rhythm: Normal rate and regular rhythm.     Heart sounds: No murmur. No friction rub. No gallop.   Pulmonary:     Effort: Pulmonary effort is normal. No respiratory distress.     Breath sounds: Normal breath sounds. No wheezing.  Chest:     Breasts:        Right: No mass, skin change or tenderness.        Left: No mass, skin change or tenderness.  Abdominal:     General: Bowel sounds are normal. There is no distension.     Palpations: Abdomen is soft.     Tenderness: There is no abdominal tenderness. There is no rebound.  Musculoskeletal: Normal range of motion.  Neurological:     Mental Status: She is alert and oriented to person, place, and time.     Cranial Nerves: No cranial nerve deficit.  Skin:    General: Skin is warm and dry.  Psychiatric:        Judgment: Judgment normal.  Vitals signs reviewed.    Assessment:  ANNUAL EXAM 1. Annual physical exam   2. Screening for cervical cancer    Screening Plan:            1.  Cervical Screening-  Pap smear done today Prior LEEP 2009  2. Breast screening- Exam annually and mammogram>40 planned   3. Labs managed by PCP  4. Counseling for contraception: IUD  For menorrhagia Not sexually active Consider IUD exchange for abnormal blood/discharge she has 3 weeks of the month    F/U  Return in about 1 year (around 10/31/2019) for Annual.  Annamarie MajorPaul Harris, MD, Merlinda FrederickFACOG [facility], [facility] 10/30/2018  3:07 PM

## 2018-10-30 NOTE — Patient Instructions (Addendum)
PAP every three years

## 2018-11-03 LAB — CYTOLOGY - PAP
Diagnosis: NEGATIVE
HPV (WINDOPATH): NOT DETECTED

## 2018-12-29 ENCOUNTER — Ambulatory Visit: Payer: BLUE CROSS/BLUE SHIELD | Admitting: Internal Medicine

## 2018-12-29 ENCOUNTER — Encounter: Payer: Self-pay | Admitting: Internal Medicine

## 2018-12-29 VITALS — BP 112/70 | HR 74 | Temp 97.8°F | Ht 65.0 in | Wt 202.8 lb

## 2018-12-29 DIAGNOSIS — E669 Obesity, unspecified: Secondary | ICD-10-CM | POA: Diagnosis not present

## 2018-12-29 DIAGNOSIS — M25561 Pain in right knee: Secondary | ICD-10-CM | POA: Insufficient documentation

## 2018-12-29 HISTORY — DX: Pain in right knee: M25.561

## 2018-12-29 MED ORDER — PHENTERMINE HCL 37.5 MG PO TABS: 37.5000 mg | ORAL_TABLET | Freq: Every day | ORAL | 0 refills | Status: DC

## 2018-12-29 NOTE — Progress Notes (Addendum)
Chief Complaint  Patient presents with  . Follow-up   F/u  1. Right knee pain lateral 2-3 max 8/10 chronic in PT and sees [facility]  2. C/w not losing wt though exercising 3days per week 45 min on the treadmill and elliptical    Review of Systems  Constitutional: Negative for weight loss.  HENT: Negative for hearing loss.   Eyes: Negative for blurred vision.  Respiratory: Negative for shortness of breath.   Cardiovascular: Negative for chest pain.  Gastrointestinal: Negative for abdominal pain.  Musculoskeletal: Positive for joint pain.  Skin: Negative for rash.  Neurological: Negative for headaches.  Psychiatric/Behavioral: Negative for depression.   Past Medical History:  Diagnosis Date  . Abnormal Pap smear of cervix   . DVT (deep venous thrombosis) (HCC) 2014   right leg was on OCP  . Migraine    in college   . STD (female)    HSV, HPV   Past Surgical History:  Procedure Laterality Date  . CERVICAL BIOPSY  W/ LOOP ELECTRODE EXCISION  2009   Family History  Problem Relation Age of Onset  . Miscarriages / [place] Mother   . Edema Mother   . Hypertension Father   . Asthma Brother   . Edema Brother        lymphedema  . Peripheral Artery Disease Maternal Grandmother        with stent  . Alcohol abuse Maternal Grandfather   . Cancer Maternal Grandfather        lung cancer mets smoker  . Alcohol abuse Paternal Grandfather   . Cancer Paternal Grandfather        pancreatitc   . Hypertension Paternal Grandfather    Social History   Socioeconomic History  . Marital status: Single    Spouse name: Not on file  . Number of children: Not on file  . Years of education: Not on file  . Highest education level: Not on file  Occupational History  . Not on file  Social Needs  . Financial resource strain: Not on file  . Food insecurity:    Worry: Not on file    Inability: Not on file  . Transportation needs:    Medical: Not on file    Non-medical: Not on  file  Tobacco Use  . Smoking status: Never Smoker  . Smokeless tobacco: Never Used  Substance and Sexual Activity  . Alcohol use: No    Alcohol/week: 0.0 standard drinks  . Drug use: No  . Sexual activity: Yes  Lifestyle  . Physical activity:    Days per week: Not on file    Minutes per session: Not on file  . Stress: Not on file  Relationships  . Social connections:    Talks on phone: Not on file    Gets together: Not on file    Attends religious service: Not on file    Active member of club or organization: Not on file    Attends meetings of clubs or organizations: Not on file    Relationship status: Not on file  . Intimate partner violence:    Fear of current or ex partner: Not on file    Emotionally abused: Not on file    Physically abused: Not on file    Forced sexual activity: Not on file  Other Topics Concern  . Not on file  Social History Narrative   College [employer]    Single    1 daughter    Customer  service dept      Never smoker    Owns guns, wears seat belt    Current Meds  Medication Sig  . Cetirizine HCl 10 MG CAPS Take 10 mg by mouth as needed. Reported on 11/03/2015  . Cholecalciferol 1.25 MG (50000 UT) capsule Take 1 capsule (50,000 Units total) by mouth once a week.  . fluticasone (FLONASE) 50 MCG/ACT nasal spray Place into both nostrils daily.   Allergies  Allergen Reactions  . Amoxicillin Rash   Recent Results (from the past 2160 hour(s))  Cytology - PAP     Status: None   Collection Time: 10/30/18 12:00 AM  Result Value Ref Range   Adequacy      Satisfactory for evaluation  endocervical/transformation zone component PRESENT.   Diagnosis      NEGATIVE FOR INTRAEPITHELIAL LESIONS OR MALIGNANCY.   HPV NOT DETECTED     Comment: Normal Reference Range - NOT Detected   Material Submitted CervicoVaginal Pap [ThinPrep Imaged]    Objective  Body mass index is 33.75 kg/m. Wt Readings from Last 3 Encounters:  12/29/18 202 lb 12.8 oz (92 kg)   10/30/18 197 lb (89.4 kg)  09/28/18 199 lb 6.4 oz (90.4 kg)   Temp Readings from Last 3 Encounters:  12/29/18 97.8 F (36.6 C) (Oral)  09/28/18 97.9 F (36.6 C) (Oral)  01/15/17 98.1 F (36.7 C) (Oral)   BP Readings from Last 3 Encounters:  12/29/18 112/70  10/30/18 100/70  09/28/18 116/78   Pulse Readings from Last 3 Encounters:  12/29/18 74  09/28/18 73  01/15/17 60    Physical Exam Vitals signs and nursing note reviewed.  Constitutional:      Appearance: Normal appearance. She is well-developed and well-groomed. She is obese.  HENT:     Head: Normocephalic and atraumatic.     Nose: Nose normal.     Mouth/Throat:     Mouth: Mucous membranes are moist.     Pharynx: Oropharynx is clear.  Eyes:     Conjunctiva/sclera: Conjunctivae normal.     Pupils: Pupils are equal, round, and reactive to light.  Cardiovascular:     Rate and Rhythm: Normal rate and regular rhythm.     Heart sounds: Normal heart sounds.  Pulmonary:     Effort: Pulmonary effort is normal.     Breath sounds: Normal breath sounds.  Musculoskeletal:     Right knee: Tenderness found. Medial joint line and lateral joint line tenderness noted.  Skin:    General: Skin is warm and dry.  Neurological:     General: No focal deficit present.     Mental Status: She is alert and oriented to person, place, and time.     Gait: Gait normal.  Psychiatric:        Attention and Perception: Attention and perception normal.        Mood and Affect: Mood and affect normal.        Speech: Speech normal.        Behavior: Behavior normal. Behavior is cooperative.        Thought Content: Thought content normal.        Cognition and Memory: Cognition and memory normal.        Judgment: Judgment normal.     Assessment   1. Right knee pain lateral and medial  Xray 09/05/18 mild medial jt space narrowing b/l moderate lateralization of patella b/l  2. Obesity  3. HM Plan   1. Consider MRI  Get  Xray [facility]   2. adipex f/u in 2 months  3.  Flu shot utd  Tdap in future daughter 35 y.o  Declines STD check today h/o HPV and HSV since 49s  Pap OB/GYN Dr. Tiburcio Pea sch 10/2018 s/p LEEP pap 10/30/18 neg neg HPV rec D3 5000 IU daily month 7   Provider: Dr. French Ana McLean-Scocuz[facility]

## 2018-12-29 NOTE — Patient Instructions (Addendum)
Vitamin D3 5000 IU daily over the counter starting month 7    Acute Knee Pain, Adult Acute knee pain is sudden and may be caused by damage, swelling, or irritation of the muscles and tissues that support your knee. The injury may result from:  A fall.  An injury to your knee from twisting motions.  A hit to the knee.  Infection. Acute knee pain may go away on its own with time and rest. If it does not, your health care provider may order tests to find the cause of the pain. These may include:  Imaging tests, such as an X-ray, MRI, or ultrasound.  Joint aspiration. In this test, fluid is removed from the knee.  Arthroscopy. In this test, a lighted tube is inserted into the knee and an image is projected onto a TV screen.  Biopsy. In this test, a sample of tissue is removed from the body and studied under a microscope. Follow these instructions at home: Pay attention to any changes in your symptoms. Take these actions to relieve your pain. If you have a knee sleeve or brace:   Wear the sleeve or brace as told by your health care provider. Remove it only as told by your health care provider.  Loosen the sleeve or brace if your toes tingle, become numb, or turn cold and blue.  Keep the sleeve or brace clean.  If the sleeve or brace is not waterproof: ? Do not let it get wet. ? Cover it with a watertight covering when you take a bath or shower. Activity  Rest your knee.  Do not do things that cause pain or make pain worse.  Avoid high-impact activities or exercises, such as running, jumping rope, or doing jumping jacks.  Work with a physical therapist to make a safe exercise program, as recommended by your health care provider. Do exercises as told by your physical therapist. Managing pain, stiffness, and swelling   If directed, put ice on the knee: ? Put ice in a plastic bag. ? Place a towel between your skin and the bag. ? Leave the ice on for 20 minutes, 2-3 times a  day.  If directed, use an elastic bandage to put pressure (compression) on your injured knee. This may control swelling, give support, and help with discomfort. General instructions  Take over-the-counter and prescription medicines only as told by your health care provider.  Raise (elevate) your knee above the level of your heart when you are sitting or lying down.  Sleep with a pillow under your knee.  Do not use any products that contain nicotine or tobacco, such as cigarettes, e-cigarettes, and chewing tobacco. These can delay healing. If you need help quitting, ask your health care provider.  If you are overweight, work with your health care provider and a dietitian to set a weight-loss goal that is healthy and reasonable for you. Extra weight can put pressure on your knee.  Keep all follow-up visits as told by your health care provider. This is important. Contact a health care provider if:  Your knee pain continues, changes, or gets worse.  You have a fever along with knee pain.  Your knee feels warm to the touch.  Your knee buckles or locks up. Get help right away if:  Your knee swells, and the swelling becomes worse.  You cannot move your knee.  You have severe pain in your knee. Summary  Acute knee pain can be caused by a fall, an injury,  an infection, or damage, swelling, or irritation of the tissues that support your knee.  Your health care provider may perform tests to find out the cause of the pain.  Pay attention to any changes in your symptoms. Relieve your pain with rest, medicines, light activity, and use of ice.  Get help if your pain continues or becomes worse, your knee swells, or you cannot move your knee. This information is not intended to replace advice given to you by your health care provider. Make sure you discuss any questions you have with your health care provider. Document Released: 08/29/2007 Document Revised: 04/13/2018 Document Reviewed:  04/13/2018 Elsevier Interactive Patient Education  2019 Elsevier Inc.   Vitamin D Deficiency Vitamin D deficiency is when your body does not have enough vitamin D. Vitamin D is important to your body for many reasons:  It helps the body to absorb two important minerals, called calcium and phosphorus.  It plays a role in bone health.  It may help to prevent some diseases, such as diabetes and multiple sclerosis.  It plays a role in muscle function, including heart function. You can get vitamin D by:  Eating foods that naturally contain vitamin D.  Eating or drinking milk or other dairy products that have vitamin D added to them.  Taking a vitamin D supplement or a multivitamin supplement that contains vitamin D.  Being in the sun. Your body naturally makes vitamin D when your skin is exposed to sunlight. Your body changes the sunlight into a form of the vitamin that the body can use. If vitamin D deficiency is severe, it can cause a condition in which your bones become soft. In adults, this condition is called osteomalacia. In children, this condition is called rickets. What are the causes? Vitamin D deficiency may be caused by:  Not eating enough foods that contain vitamin D.  Not getting enough sun exposure.  Having certain digestive system diseases that make it difficult for your body to absorb vitamin D. These diseases include Crohn disease, chronic pancreatitis, and cystic fibrosis.  Having a surgery in which a part of the stomach or a part of the small intestine is removed.  Being obese.  Having chronic kidney disease or liver disease. What increases the risk? This condition is more likely to develop in:  Older people.  People who do not spend much time outdoors.  People who live in a long-term care facility.  People who have had broken bones.  People with weak or thin bones (osteoporosis).  People who have a disease or condition that changes how the body  absorbs vitamin D.  People who have dark skin.  People who take certain medicines, such as steroid medicines or certain seizure medicines.  People who are overweight or obese. What are the signs or symptoms? In mild cases of vitamin D deficiency, there may not be any symptoms. If the condition is severe, symptoms may include:  Bone pain.  Muscle pain.  Falling often.  Broken bones caused by a minor injury. How is this diagnosed? This condition is usually diagnosed with a blood test. How is this treated? Treatment for this condition may depend on what caused the condition. Treatment options include:  Taking vitamin D supplements.  Taking a calcium supplement. Your health care provider will suggest what dose is best for you. Follow these instructions at home:  Take medicines and supplements only as told by your health care provider.  Eat foods that contain vitamin D. Choices  include: ? Fortified dairy products, cereals, or juices. Fortified means that vitamin D has been added to the food. Check the label on the package to be sure. ? Fatty fish, such as salmon or trout. ? Eggs. ? Oysters.  Do not use a tanning bed.  Maintain a healthy weight. Lose weight, if needed.  Keep all follow-up visits as told by your health care provider. This is important. Contact a health care provider if:  Your symptoms do not go away.  You feel like throwing up (nausea) or you throw up (vomit).  You have fewer bowel movements than usual or it is difficult for you to have a bowel movement (constipation). This information is not intended to replace advice given to you by your health care provider. Make sure you discuss any questions you have with your health care provider. Document Released: 01/24/2012 Document Revised: 04/14/2016 Document Reviewed: 03/19/2015 Elsevier Interactive Patient Education  2019 [employer]

## 2018-12-29 NOTE — Progress Notes (Signed)
Pre visit review using our clinic review tool, if applicable. No additional management support is needed unless otherwise documented below in the visit note. 

## 2019-01-01 NOTE — Progress Notes (Signed)
tdap not in [facility] 

## 2019-01-23 ENCOUNTER — Ambulatory Visit: Payer: Self-pay

## 2019-01-23 DIAGNOSIS — T887XXA Unspecified adverse effect of drug or medicament, initial encounter: Secondary | ICD-10-CM | POA: Diagnosis not present

## 2019-01-23 NOTE — Telephone Encounter (Signed)
Pt called stating that she is experiencing chest tightness She states that the tightness goes to the lower neck. Not into the jaw. She also has numbness in her fingers. She states that 2 weeks ago she was sick and placed on a Zpac and prednisone for cough by wellness center at work. She has started a new medication Phentermine. She states the tightness in her chest and the numbness in her fingers started after starting the medication. Pt denies nausea dizziness sweats. She denies chest pain. She has family Hx of hypertension. Per protocol pt will go to ED for evaluation of symptoms.  Care advice read to patient. Pt verbalized understanding of all instructions.  Reason for Disposition . Pain also present in shoulder(s) or arm(s) or jaw  (Exception: pain is clearly made worse by movement)  Answer Assessment - Initial Assessment Questions 1. LOCATION: "Where does it hurt?"       Tightness lower neck and center chest 2. RADIATION: "Does the pain go anywhere else?" (e.g., into neck, jaw, arms, back)    Numbness to fingers no pain  3. ONSET: "When did the chest pain begin?" (Minutes, hours or days)      Numbness this week tightness in chest last weak 4. PATTERN "Does the pain come and go, or has it been constant since it started?"  "Does it get worse with exertion?"      Constant especially with movement 5. DURATION: "How long does it last" (e.g., seconds, minutes, hours)     constant 6. SEVERITY: "How bad is the pain?"  (e.g., Scale 1-10; mild, moderate, or severe)    - MILD (1-3): doesn't interfere with normal activities     - MODERATE (4-7): interferes with normal activities or awakens from sleep    - SEVERE (8-10): excruciating pain, unable to do any normal activities       4 7. CARDIAC RISK FACTORS: "Do you have any history of heart problems or risk factors for heart disease?" (e.g., prior heart attack, angina; high blood pressure, diabetes, being overweight, high cholesterol, smoking, or strong  family history of heart disease)   No family HX of hypertension 8. PULMONARY RISK FACTORS: "Do you have any history of lung disease?"  (e.g., blood clots in lung, asthma, emphysema, birth control pills)    No not in lungs but in leg 2014 9. CAUSE: "What do you think is causing the chest pain?"     Phentermine last year with allergies she had the tightness and was treated for allergies 10. OTHER SYMPTOMS: "Do you have any other symptoms?" (e.g., dizziness, nausea, vomiting, sweating, fever, difficulty breathing, cough)      cough 11. PREGNANCY: "Is there any chance you are pregnant?" "When was your last menstrual period?"       No IUD  Protocols used: CHEST PAIN-A-AH

## 2019-02-28 ENCOUNTER — Ambulatory Visit: Payer: BLUE CROSS/BLUE SHIELD | Admitting: Internal Medicine

## 2019-02-28 ENCOUNTER — Ambulatory Visit (INDEPENDENT_AMBULATORY_CARE_PROVIDER_SITE_OTHER): Payer: BLUE CROSS/BLUE SHIELD | Admitting: Internal Medicine

## 2019-02-28 DIAGNOSIS — E669 Obesity, unspecified: Secondary | ICD-10-CM

## 2019-02-28 DIAGNOSIS — E559 Vitamin D deficiency, unspecified: Secondary | ICD-10-CM

## 2019-02-28 DIAGNOSIS — M25561 Pain in right knee: Secondary | ICD-10-CM

## 2019-02-28 NOTE — Progress Notes (Signed)
Virtual Visit via Video Note Doxy  I connected with Emily Griffin  on 02/28/19 at  2:05 PM EDT by a video enabled telemedicine application and verified that I am speaking with the correct person using two identifiers.  Location patient: home Location provider:work  Persons participating in the virtual visit: patient, provider  I discussed the limitations of evaluation and management by telemedicine and the availability of in person appointments. The patient expressed understanding and agreed to proceed.   HPI: 1. Obesity was on adipex 9 37.5 x 3 weeks and had dry mouth and trouble sleeping then fingers started to get numb and had sob when she ws sick and given Zpack, prednisone, albuterol inhaler before this She was tolerating phenterimine and losing weight lost 15 lbs She felt at the time weight was on her chest and had increased anxiety as well with chest tightness stopped phentermine x 2 weeks and now feeling better since off other meds as well and wants to know if should resume adipex.  She thinks she is now 190 lbs  2. Vitamin D def taking weekly D3  3. Right knee pain see last note re [facility]    ROS: See pertinent positives and negatives per HPI.  Past Medical History:  Diagnosis Date  . Abnormal Pap smear of cervix   . DVT (deep venous thrombosis) (HCC) 2014   right leg was on OCP  . Migraine    in college   . STD (female)    HSV, HPV    Past Surgical History:  Procedure Laterality Date  . CERVICAL BIOPSY  W/ LOOP ELECTRODE EXCISION  2009    Family History  Problem Relation Age of Onset  . Miscarriages / [place] Mother   . Edema Mother   . Hypertension Father   . Asthma Brother   . Edema Brother        lymphedema  . Peripheral Artery Disease Maternal Grandmother        with stent  . Alcohol abuse Maternal Grandfather   . Cancer Maternal Grandfather        lung cancer mets smoker  . Alcohol abuse Paternal Grandfather   . Cancer Paternal Grandfather         pancreatitc   . Hypertension Paternal Grandfather     SOCIAL HX: 1 daughter   Current Outpatient Medications:  .  Cetirizine HCl 10 MG CAPS, Take 10 mg by mouth as needed. Reported on 11/03/2015, Disp: , Rfl:  .  Cholecalciferol 1.25 MG (50000 UT) capsule, Take 1 capsule (50,000 Units total) by mouth once a week., Disp: 13 capsule, Rfl: 1 .  fluticasone (FLONASE) 50 MCG/ACT nasal spray, Place into both nostrils daily., Disp: , Rfl:  .  phentermine (ADIPEX-P) 37.5 MG tablet, Take 1 tablet (37.5 mg total) by mouth daily before breakfast., Disp: 60 tablet, Rfl: 0  EXAM:  VITALS per patient if applicable:  GENERAL: alert, oriented, appears well and in no acute distress  HEENT: atraumatic, conjunttiva clear, no obvious abnormalities on inspection of external nose and ears  NECK: normal movements of the head and neck  LUNGS: on inspection no signs of respiratory distress, breathing rate appears normal, no obvious gross SOB, gasping or wheezing  CV: no obvious cyanosis  MS: moves all visible extremities without noticeable abnormality  PSYCH/NEURO: pleasant and cooperative, no obvious depression or anxiety, speech and thought processing grossly intact  ASSESSMENT AND PLAN: Discussed the following assessment and plan:  Obesity (BMI 30-39.9)-resume adipex cut  in 1/2 x 1 week and my chart back if tolerating will refill another 2 month supply and pt to try to get 1 pill qam   Vitamin D deficiency-continue vitamin D  Acute pain of right knee-if still bothersome will rec f/u ortho and MRI right knee    Flu shot utd  Tdap in future daughter 8 y.o maybe had 3 years ago   Declines STD check today h/o HPV and HSV since 19s  Pap OB/GYN Dr. Tiburcio Pea sch 10/2018 s/p LEEPpap 10/30/18 neg neg HPV rec D3 5000 IU daily month 7     I discussed the assessment and treatment plan with the patient. The patient was provided an opportunity to ask questions and all were answered. The patient  agreed with the plan and demonstrated an understanding of the instructions.   The patient was advised to call back or seek an in-person evaluation if the symptoms worsen or if the condition fails to improve as anticipated.  Time spent 15 minutes  Bevelyn Buckles, MD

## 2019-04-05 ENCOUNTER — Other Ambulatory Visit: Payer: Self-pay | Admitting: Internal Medicine

## 2019-04-05 DIAGNOSIS — E669 Obesity, unspecified: Secondary | ICD-10-CM

## 2019-06-07 ENCOUNTER — Ambulatory Visit (INDEPENDENT_AMBULATORY_CARE_PROVIDER_SITE_OTHER): Payer: BC Managed Care – PPO | Admitting: Internal Medicine

## 2019-06-07 ENCOUNTER — Other Ambulatory Visit: Payer: Self-pay

## 2019-06-07 DIAGNOSIS — M791 Myalgia, unspecified site: Secondary | ICD-10-CM

## 2019-06-07 DIAGNOSIS — R200 Anesthesia of skin: Secondary | ICD-10-CM | POA: Diagnosis not present

## 2019-06-07 DIAGNOSIS — R252 Cramp and spasm: Secondary | ICD-10-CM | POA: Diagnosis not present

## 2019-06-07 DIAGNOSIS — E559 Vitamin D deficiency, unspecified: Secondary | ICD-10-CM

## 2019-06-07 DIAGNOSIS — R202 Paresthesia of skin: Secondary | ICD-10-CM

## 2019-06-07 DIAGNOSIS — R5383 Other fatigue: Secondary | ICD-10-CM

## 2019-06-07 DIAGNOSIS — E611 Iron deficiency: Secondary | ICD-10-CM

## 2019-06-07 DIAGNOSIS — Z8672 Personal history of thrombophlebitis: Secondary | ICD-10-CM

## 2019-06-07 DIAGNOSIS — M6281 Muscle weakness (generalized): Secondary | ICD-10-CM | POA: Diagnosis not present

## 2019-06-07 DIAGNOSIS — E538 Deficiency of other specified B group vitamins: Secondary | ICD-10-CM

## 2019-06-07 NOTE — Progress Notes (Signed)
Virtual Visit via Video Note  I connected with Emily BeltonLatavia Griffin   on 06/07/19 at  4:00 PM EDT by a video enabled telemedicine application and verified that I am speaking with the correct person using two identifiers.  Location patient: home Location provider:work or home office Persons participating in the virtual visit: patient, provider  I discussed the limitations of evaluation and management by telemedicine and the availability of in person appointments. The patient expressed understanding and agreed to proceed.   HPI: Multiple nonspecific complaints x 2 weeks.   Numbness and tingling varying locations of arms and legs, shoulders, hands, knuckles which move around to arms legs and hands. Arms and hands feel heavy weak and numb though motor function intact. This will last about 30-45 minutes today she had it in left arm and left hand and right hand   Myalgia/muscle weakness and muscle cramps diffuse arms and legs worse for the last 2 weeks and increased freq. And intensity.  Muscle cramps in neck, fingers, toes, shoulders, legs which interfere with her job at work. She has been back at work x 2 weeks. Initially this was happening 1x per week now happening daily increased intensity and freq. The only medications she is taking are Zyrtec prn which is not new, Goji apple cider vinegar which is new w/in the last 2 months, Tums 2x per day for GERD and 1x per week vitamin D.   Fatigue +   Stress level increased x 2 weeks due to being back at work and not working from home   H/a with h/o migraines in college. She had h/a last week lasting x 24 hrs with nausea tried otc migraine medication w/o help no blurry vision h/a just went away  GERD new since last visit she has been taking 2 tums to help dad has GERD issues   H/o DVT right lower leg with chronic leg edema which she wears compression stockings and 1 week ago c/o worsening leg swelling due to being on her feet helping her mom. Denies redness  pain and swelling went down with elevation. She did not have her compression stockings on when she was on her feet for a while.   Weight gain of 20 lbs since COVID 19 eating more sweets    ROS: See pertinent positives and negatives per HPI.  Past Medical History:  Diagnosis Date  . Abnormal Pap smear of cervix   . DVT (deep venous thrombosis) (HCC) 2014   right leg was on OCP  . Migraine    in college   . STD (female)    HSV, HPV    Past Surgical History:  Procedure Laterality Date  . CERVICAL BIOPSY  W/ LOOP ELECTRODE EXCISION  2009    Family History  Problem Relation Age of Onset  . Miscarriages / [place] Mother   . Edema Mother   . Hypertension Father   . Asthma Brother   . Edema Brother        lymphedema  . Peripheral Artery Disease Maternal Grandmother        with stent  . Alcohol abuse Maternal Grandfather   . Cancer Maternal Grandfather        lung cancer mets smoker  . Alcohol abuse Paternal Grandfather   . Cancer Paternal Grandfather        pancreatitc   . Hypertension Paternal Grandfather     SOCIAL HX:  [employer]  Single  1 daughter  Customer service dept  Never smoker  Owns guns, wears seat belt    Current Outpatient Medications:  .  Cetirizine HCl 10 MG CAPS, Take 10 mg by mouth as needed. Reported on 11/03/2015, Disp: , Rfl:  .  Cholecalciferol 1.25 MG (50000 UT) capsule, Take 1 capsule (50,000 Units total) by mouth once a week., Disp: 13 capsule, Rfl: 1 .  fluticasone (FLONASE) 50 MCG/ACT nasal spray, Place into both nostrils daily., Disp: , Rfl:   EXAM:  VITALS per patient if applicable:  GENERAL: alert, oriented, appears well and in no acute distress  HEENT: atraumatic, conjunttiva clear, no obvious abnormalities on inspection of external nose and ears  NECK: normal movements of the head and neck  LUNGS: on inspection no signs of respiratory distress, breathing rate appears normal, no obvious gross SOB, gasping or  wheezing  CV: no obvious cyanosis  MS: moves all visible extremities without noticeable abnormality  PSYCH/NEURO: pleasant and cooperative, no obvious depression or anxiety, speech and thought processing grossly intact  ASSESSMENT AND PLAN:  Discussed the following assessment and plan:  Multiple nonspecific complaints r/o metabolic d/o vs vitamin Def/overload vs autoimmune. HIV test was negative 11/03/15 and she is not sexually active. R/o autoimmune. Etiology also could be due to increased stress due to work.  If blood w/u negative consider MRI brain w/ and w/o r/o MS. If all w/u normal consider neurology further testing    Fatigue, unspecified type - Plan: Comprehensive metabolic panel, CBC w/Diff, Iron, TIBC and Ferritin Panel, TSH, Antinuclear Antib (ANA), Cortisol, Sedimentation rate, C-reactive protein, see above  Muscle cramps - Plan: Comprehensive metabolic panel, Magnesium, CK (Creatine Kinase), TSH, Antinuclear Antib (ANA), Cortisol, Sedimentation rate, C-reactive protein, see above   Muscle weakness - Plan: Magnesium, CK (Creatine Kinase), TSH, Antinuclear Antib (ANA), Cortisol, Sedimentation rate, C-reactive protein, see above   Numbness and tingling - Plan: TSH, Antinuclear Antib (ANA), Cortisol, Sedimentation rate, C-reactive protein, see above   Iron deficiency - Plan: Iron, TIBC and Ferritin Panel, see above   Hypomagnesemia - Plan: Magnesium, see above   Vitamin D deficiency - Plan: Vitamin D (25 hydroxy), see above   Myalgia - Plan: CK (Creatine Kinase), Antinuclear Antib (ANA), Sedimentation rate, C-reactive protein, see above  B12 deficiency - Plan: B12, see above   History of deep vein thrombophlebitis of lower extremity right - Plan: no evidence of DVT likely chronic lymphedema post DVT encourage to wear compression stockings    HM-physical at f/u  Flu shotutd Tdap in futuredaughter 3 y.omaybe had I.e 2016  Declines STD check today h/o HPV and HSV  since 20s  HIV neg 11/03/15 Pap OB/GYN Dr. Kenton Kingfisher sch 10/2018 s/p LEEPpap 10/30/18 neg neg HPV rec D3 5000 IU daily month 7 check vitamin D now    I discussed the assessment and treatment plan with the patient. The patient was provided an opportunity to ask questions and all were answered. The patient agreed with the plan and demonstrated an understanding of the instructions.   The patient was advised to call back or seek an in-person evaluation if the symptoms worsen or if the condition fails to improve as anticipated.  Time spent 25 minutes  Delorise Jackson, MD

## 2019-06-08 DIAGNOSIS — M6281 Muscle weakness (generalized): Secondary | ICD-10-CM | POA: Diagnosis not present

## 2019-06-08 DIAGNOSIS — E559 Vitamin D deficiency, unspecified: Secondary | ICD-10-CM | POA: Diagnosis not present

## 2019-06-08 DIAGNOSIS — R252 Cramp and spasm: Secondary | ICD-10-CM | POA: Diagnosis not present

## 2019-06-08 DIAGNOSIS — E538 Deficiency of other specified B group vitamins: Secondary | ICD-10-CM | POA: Diagnosis not present

## 2019-06-08 DIAGNOSIS — R200 Anesthesia of skin: Secondary | ICD-10-CM | POA: Diagnosis not present

## 2019-06-08 DIAGNOSIS — R202 Paresthesia of skin: Secondary | ICD-10-CM | POA: Diagnosis not present

## 2019-06-08 DIAGNOSIS — R5383 Other fatigue: Secondary | ICD-10-CM | POA: Diagnosis not present

## 2019-06-08 DIAGNOSIS — E611 Iron deficiency: Secondary | ICD-10-CM | POA: Diagnosis not present

## 2019-06-08 NOTE — Patient Instructions (Signed)
Paresthesia Paresthesia is an abnormal burning or prickling sensation. It is usually felt in the hands, arms, legs, or feet. However, it may occur in any part of the body. Usually, paresthesia is not painful. It may feel like:  Tingling or numbness.  Buzzing.  Itching. Paresthesia may occur without any clear cause, or it may be caused by:  Breathing too quickly (hyperventilation).  Pressure on a nerve.  An underlying medical condition.  Side effects of a medication.  Nutritional deficiencies.  Exposure to toxic chemicals. Most people experience temporary (transient) paresthesia at some time in their lives. For some people, it may be long-lasting (chronic) because of an underlying medical condition. If you have paresthesia that lasts a long time, you may need to be evaluated by your health care provider. Follow these instructions at home: Alcohol use   Do not drink alcohol if: ? Your health care provider tells you not to drink. ? You are pregnant, may be pregnant, or are planning to become pregnant.  If you drink alcohol: ? Limit how much you use to:  0-1 drink a day for women.  0-2 drinks a day for men. ? Be aware of how much alcohol is in your drink. In the [place] one drink equals one 12 oz bottle of beer (355 mL), one 5 oz glass of wine (148 mL), or one 1 oz glass of hard liquor (44 mL). Nutrition   Eat a healthy diet. This includes: ? Eating foods that are high in fiber, such as fresh fruits and vegetables, whole grains, and beans. ? Limiting foods that are high in fat and processed sugars, such as fried or sweet foods. General instructions  Take over-the-counter and prescription medicines only as told by your health care provider.  Do not use any products that contain nicotine or tobacco, such as cigarettes and e-cigarettes. These can keep blood from reaching damaged nerves. If you need help quitting, ask your health care provider.  If you have diabetes, work  closely with your health care provider to keep your blood sugar under control.  If you have numbness in your feet: ? Check every day for signs of injury or infection. Watch for redness, warmth, and swelling. ? Wear padded socks and comfortable shoes. These help protect your feet.  Keep all follow-up visits as told by your health care provider. This is important. Contact a health care provider if you:  Have paresthesia that gets worse or does not go away.  Have a burning or prickling feeling that gets worse when you walk.  Have pain, cramps, or dizziness.  Develop a rash. Get help right away if you:  Feel weak.  Have trouble walking or moving.  Have problems with speech, understanding, or vision.  Feel confused.  Cannot control your bladder or bowel movements.  Have numbness after an injury.  Develop new weakness in an arm or leg.  Faint. Summary  Paresthesia is an abnormal burning or prickling sensation that is usually felt in the hands, arms, legs, or feet. It may also occur in other parts of the body.  Paresthesia may occur without any clear cause, or it may be caused by breathing too quickly (hyperventilation), pressure on a nerve, an underlying medical condition, side effects of a medication, nutritional deficiencies, or exposure to toxic chemicals.  If you have paresthesia that lasts a long time, you may need to be evaluated by your health care provider. This information is not intended to replace advice given to you by   your health care provider. Make sure you discuss any questions you have with your health care provider. Document Released: 10/22/2002 Document Revised: 11/27/2018 Document Reviewed: 11/10/2017 Elsevier Patient Education  2020 [place] for Gastroesophageal Reflux Disease, Adult When you have gastroesophageal reflux disease (GERD), the foods you eat and your eating habits are very important. Choosing the right foods can help ease the  discomfort of GERD. Consider working with a diet and nutrition specialist (dietitian) to help you make healthy food choices. What general guidelines should I follow?  Eating plan  Choose healthy foods low in fat, such as fruits, vegetables, whole grains, low-fat dairy products, and lean meat, fish, and poultry.  Eat frequent, small meals instead of three large meals each day. Eat your meals slowly, in a relaxed setting. Avoid bending over or lying down until 2-3 hours after eating.  Limit high-fat foods such as fatty meats or fried foods.  Limit your intake of oils, butter, and shortening to less than 8 teaspoons each day.  Avoid the following: ? Foods that cause symptoms. These may be different for different people. Keep a food diary to keep track of foods that cause symptoms. ? Alcohol. ? Drinking large amounts of liquid with meals. ? Eating meals during the 2-3 hours before bed.  Cook foods using methods other than frying. This may include baking, grilling, or broiling. Lifestyle  Maintain a healthy weight. Ask your health care provider what weight is healthy for you. If you need to lose weight, work with your health care provider to do so safely.  Exercise for at least 30 minutes on 5 or more days each week, or as told by your health care provider.  Avoid wearing clothes that fit tightly around your waist and chest.  Do not use any products that contain nicotine or tobacco, such as cigarettes and e-cigarettes. If you need help quitting, ask your health care provider.  Sleep with the head of your bed raised. Use a wedge under the mattress or blocks under the bed frame to raise the head of the bed. What foods are not recommended? The items listed may not be a complete list. Talk with your dietitian about what dietary choices are best for you. Grains Pastries or quick breads with added fat. [place] toast. Vegetables Deep fried vegetables. [place] fries. Any vegetables prepared with  added fat. Any vegetables that cause symptoms. For some people this may include tomatoes and tomato products, chili peppers, onions and garlic, and horseradish. Fruits Any fruits prepared with added fat. Any fruits that cause symptoms. For some people this may include citrus fruits, such as oranges, grapefruit, pineapple, and lemons. Meats and other protein foods High-fat meats, such as fatty beef or pork, hot dogs, ribs, ham, sausage, salami and bacon. Fried meat or protein, including fried fish and fried chicken. Nuts and nut butters. Dairy Whole milk and chocolate milk. Sour cream. Cream. Ice cream. Cream cheese. Milk shakes. Beverages Coffee and tea, with or without caffeine. Carbonated beverages. Sodas. Energy drinks. Fruit juice made with acidic fruits (such as orange or grapefruit). Tomato juice. Alcoholic drinks. Fats and oils Butter. Margarine. Shortening. Ghee. Sweets and desserts Chocolate and cocoa. Donuts. Seasoning and other foods Pepper. Peppermint and spearmint. Any condiments, herbs, or seasonings that cause symptoms. For some people, this may include curry, hot sauce, or vinegar-based salad dressings. Summary  When you have gastroesophageal reflux disease (GERD), food and lifestyle choices are very important to help ease the discomfort of  GERD.  Eat frequent, small meals instead of three large meals each day. Eat your meals slowly, in a relaxed setting. Avoid bending over or lying down until 2-3 hours after eating.  Limit high-fat foods such as fatty meat or fried foods. This information is not intended to replace advice given to you by your health care provider. Make sure you discuss any questions you have with your health care provider. Document Released: 11/01/2005 Document Revised: 02/22/2019 Document Reviewed: 11/02/2016 Elsevier Patient Education  2020 [employer] Inc[employer]  Muscle Cramps and Spasms Muscle cramps and spasms are when muscles tighten by themselves. They  usually get better within minutes. Muscle cramps are painful. They are usually stronger and last longer than muscle spasms. Muscle spasms may or may not be painful. They can last a few seconds or much longer. Cramps and spasms can affect any muscle, but they occur most often in the calf muscles of the leg. They are usually not caused by a serious problem. In many cases, the cause is not known. Some common causes include:  Doing more physical work or exercise than your body is ready for.  Using the muscles too much (overuse) by repeating certain movements too many times.  Staying in a certain position for a long time.  Playing a sport or doing an activity without preparing properly.  Using bad form or technique while playing a sport or doing an activity.  Not having enough water in your body (dehydration).  Injury.  Side effects of some medicines.  Low levels of the salts and minerals in your blood (electrolytes), such as low potassium or calcium. Follow these instructions at home: Managing pain and stiffness      Massage, stretch, and relax the muscle. Do this for many minutes at a time.  If told, put heat on tight or tense muscles as often as told by your doctor. Use the heat source that your doctor recommends, such as a moist heat pack or a heating pad. ? Place a towel between your skin and the heat source. ? Leave the heat on for 20-30 minutes. ? Remove the heat if your skin turns bright red. This is very important if you are not able to feel pain, heat, or cold. You may have a greater risk of getting burned.  If told, put ice on the affected area. This may help if you are sore or have pain after a cramp or spasm. ? Put ice in a plastic bag. ? Place a towel between your skin and the bag. ? Leave the ice on for 20 minutes, 2-3 times a day.  Try taking hot showers or baths to help relax tight muscles. Eating and drinking  Drink enough fluid to keep your pee (urine) pale  yellow.  Eat a healthy diet to help ensure that your muscles work well. This should include: ? Fruits and vegetables. ? Lean protein. ? Whole grains. ? Low-fat or nonfat dairy products. General instructions  If you are having cramps often, avoid intense exercise for several days.  Take over-the-counter and prescription medicines only as told by your doctor.  Watch for any changes in your symptoms.  Keep all follow-up visits as told by your doctor. This is important. Contact a doctor if:  Your cramps or spasms get worse or happen more often.  Your cramps or spasms do not get better with time. Summary  Muscle cramps and spasms are when muscles tighten by themselves. They usually get better within minutes.  Cramps  and spasms occur most often in the calf muscles of the leg.  Massage, stretch, and relax the muscle. This may help the cramp or spasm go away.  Drink enough fluid to keep your pee (urine) pale yellow. This information is not intended to replace advice given to you by your health care provider. Make sure you discuss any questions you have with your health care provider. Document Released: 10/14/2008 Document Revised: 03/27/2018 Document Reviewed: 03/27/2018 Elsevier Patient Education  2020 [employer]

## 2019-06-09 LAB — COMPREHENSIVE METABOLIC PANEL
ALT: 11 IU/L (ref 0–32)
AST: 11 IU/L (ref 0–40)
Albumin/Globulin Ratio: 1.9 (ref 1.2–2.2)
Albumin: 4 g/dL (ref 3.8–4.8)
Alkaline Phosphatase: 68 IU/L (ref 39–117)
BUN/Creatinine Ratio: 26 — ABNORMAL HIGH (ref 9–23)
BUN: 17 mg/dL (ref 6–20)
Bilirubin Total: 0.4 mg/dL (ref 0.0–1.2)
CO2: 21 mmol/L (ref 20–29)
Calcium: 9.7 mg/dL (ref 8.7–10.2)
Chloride: 105 mmol/L (ref 96–106)
Creatinine, Ser: 0.66 mg/dL (ref 0.57–1.00)
GFR calc Af Amer: 133 mL/min/{1.73_m2} (ref >59)
GFR calc non Af Amer: 116 mL/min/{1.73_m2} (ref >59)
Globulin, Total: 2.1 g/dL (ref 1.5–4.5)
Glucose: 95 mg/dL (ref 65–99)
Potassium: 4.1 mmol/L (ref 3.5–5.2)
Sodium: 140 mmol/L (ref 134–144)
Total Protein: 6.1 g/dL (ref 6.0–8.5)

## 2019-06-09 LAB — CBC WITH DIFFERENTIAL/PLATELET
Basophils Absolute: 0 10*3/uL (ref 0.0–0.2)
Basos: 0 %
EOS (ABSOLUTE): 0.1 10*3/uL (ref 0.0–0.4)
Eos: 2 %
Hematocrit: 39.3 % (ref 34.0–46.6)
Hemoglobin: 13.3 g/dL (ref 11.1–15.9)
Immature Grans (Abs): 0 10*3/uL (ref 0.0–0.1)
Immature Granulocytes: 0 %
Lymphocytes Absolute: 2.3 10*3/uL (ref 0.7–3.1)
Lymphs: 51 %
MCH: 31.7 pg (ref 26.6–33.0)
MCHC: 33.8 g/dL (ref 31.5–35.7)
MCV: 94 fL (ref 79–97)
Monocytes Absolute: 0.4 10*3/uL (ref 0.1–0.9)
Monocytes: 9 %
Neutrophils Absolute: 1.8 10*3/uL (ref 1.4–7.0)
Neutrophils: 38 %
Platelets: 200 10*3/uL (ref 150–450)
RBC: 4.2 x10E6/uL (ref 3.77–5.28)
RDW: 12.8 % (ref 11.7–15.4)
WBC: 4.6 10*3/uL (ref 3.4–10.8)

## 2019-06-09 LAB — CORTISOL: Cortisol: 8.9 ug/dL

## 2019-06-09 LAB — CK: Total CK: 59 U/L (ref 32–182)

## 2019-06-09 LAB — VITAMIN B12: Vitamin B-12: 380 pg/mL (ref 232–1245)

## 2019-06-09 LAB — VITAMIN D 25 HYDROXY (VIT D DEFICIENCY, FRACTURES): Vit D, 25-Hydroxy: 77.6 ng/mL (ref 30.0–100.0)

## 2019-06-09 LAB — SEDIMENTATION RATE: Sed Rate: 7 mm/hr (ref 0–32)

## 2019-06-09 LAB — IRON,TIBC AND FERRITIN PANEL
Ferritin: 80 ng/mL (ref 15–150)
Iron Saturation: 36 % (ref 15–55)
Iron: 98 ug/dL (ref 27–159)
Total Iron Binding Capacity: 270 ug/dL (ref 250–450)
UIBC: 172 ug/dL (ref 131–425)

## 2019-06-09 LAB — C-REACTIVE PROTEIN: CRP: <1 mg/L (ref 0–10)

## 2019-06-09 LAB — ANA: Anti Nuclear Antibody (ANA): NEGATIVE

## 2019-06-09 LAB — TSH: TSH: 1.35 u[IU]/mL (ref 0.450–4.500)

## 2019-06-09 LAB — MAGNESIUM: Magnesium: 1.9 mg/dL (ref 1.6–2.3)

## 2019-07-11 ENCOUNTER — Other Ambulatory Visit: Payer: Self-pay

## 2019-07-11 DIAGNOSIS — Z20822 Contact with and (suspected) exposure to covid-19: Secondary | ICD-10-CM

## 2019-07-11 DIAGNOSIS — R6889 Other general symptoms and signs: Secondary | ICD-10-CM | POA: Diagnosis not present

## 2019-07-12 LAB — NOVEL CORONAVIRUS, NAA: SARS-CoV-2, NAA: NOT DETECTED

## 2019-09-05 ENCOUNTER — Other Ambulatory Visit: Payer: Self-pay

## 2019-09-06 ENCOUNTER — Ambulatory Visit (INDEPENDENT_AMBULATORY_CARE_PROVIDER_SITE_OTHER): Payer: BC Managed Care – PPO | Admitting: Internal Medicine

## 2019-09-06 ENCOUNTER — Encounter: Payer: Self-pay | Admitting: Internal Medicine

## 2019-09-06 VITALS — BP 112/76 | HR 58 | Temp 97.9°F | Ht 65.0 in | Wt 194.2 lb

## 2019-09-06 DIAGNOSIS — Z0000 Encounter for general adult medical examination without abnormal findings: Secondary | ICD-10-CM | POA: Diagnosis not present

## 2019-09-06 DIAGNOSIS — G5603 Carpal tunnel syndrome, bilateral upper limbs: Secondary | ICD-10-CM | POA: Diagnosis not present

## 2019-09-06 DIAGNOSIS — K219 Gastro-esophageal reflux disease without esophagitis: Secondary | ICD-10-CM | POA: Diagnosis not present

## 2019-09-06 MED ORDER — OMEPRAZOLE 40 MG PO CPDR: 40.0000 mg | DELAYED_RELEASE_CAPSULE | Freq: Every day | ORAL | 3 refills | Status: DC

## 2019-09-06 NOTE — Progress Notes (Signed)
Chief Complaint  Patient presents with  . Follow-up   Annual  1. Doing well muscle aches improved, numbness/tingling in hands to arms improved though still gets L>R thinks CTS, fatigue, stress/anxiety improved and improved h/a with h/o migraines in college  2. GERD to the point of throat is sore at times on prilosec 20 mg bid otc and helps with sore throat she stopped coffee to help with sxs    Review of Systems  Constitutional: Positive for weight loss. Negative for malaise/fatigue.       Wt loss 202 to 194 trying   HENT: Negative for hearing loss.   Eyes: Negative for blurred vision.  Respiratory: Negative for shortness of breath.   Cardiovascular: Negative for chest pain.  Gastrointestinal: Negative for abdominal pain.  Musculoskeletal: Negative for falls.  Skin: Negative for rash.  Neurological: Positive for tingling. Negative for headaches.  Psychiatric/Behavioral: The patient is not nervous/anxious.    Past Medical History:  Diagnosis Date  . Abnormal Pap smear of cervix   . DVT (deep venous thrombosis) (HCC) 2014   right leg was on OCP  . Migraine    in college   . STD (female)    HSV, HPV   Past Surgical History:  Procedure Laterality Date  . CERVICAL BIOPSY  W/ LOOP ELECTRODE EXCISION  2009   Family History  Problem Relation Age of Onset  . Miscarriages / [place] Mother   . Edema Mother   . Hypertension Father   . Asthma Brother   . Edema Brother        lymphedema  . Peripheral Artery Disease Maternal Grandmother        with stent  . Alcohol abuse Maternal Grandfather   . Cancer Maternal Grandfather        lung cancer mets smoker  . Alcohol abuse Paternal Grandfather   . Cancer Paternal Grandfather        pancreatitc   . Hypertension Paternal Grandfather    Social History   Socioeconomic History  . Marital status: Single    Spouse name: Not on file  . Number of children: Not on file  . Years of education: Not on file  . Highest education  level: Not on file  Occupational History  . Not on file  Social Needs  . Financial resource strain: Not on file  . Food insecurity    Worry: Not on file    Inability: Not on file  . Transportation needs    Medical: Not on file    Non-medical: Not on file  Tobacco Use  . Smoking status: Never Smoker  . Smokeless tobacco: Never Used  Substance and Sexual Activity  . Alcohol use: No    Alcohol/week: 0.0 standard drinks  . Drug use: No  . Sexual activity: Yes  Lifestyle  . Physical activity    Days per week: Not on file    Minutes per session: Not on file  . Stress: Not on file  Relationships  . Social Musicianconnections    Talks on phone: Not on file    Gets together: Not on file    Attends religious service: Not on file    Active member of club or organization: Not on file    Attends meetings of clubs or organizations: Not on file    Relationship status: Not on file  . Intimate partner violence    Fear of current or ex partner: Not on file    Emotionally abused: Not on file  Physically abused: Not on file    Forced sexual activity: Not on file  Other Topics Concern  . Not on file  Social History Narrative   College [employer]    Single    1 daughter    Customer service dept      Never smoker    Owns guns, wears seat belt    Current Meds  Medication Sig  . Cetirizine HCl 10 MG CAPS Take 10 mg by mouth as needed. Reported on 11/03/2015  . fluticasone (FLONASE) 50 MCG/ACT nasal spray Place into both nostrils daily.  Marland Kitchen omeprazole (PRILOSEC) 40 MG capsule Take 1 capsule (40 mg total) by mouth daily. 30 minutes  . VITAMIN D PO Take by mouth.  . [DISCONTINUED] Cholecalciferol 1.25 MG (50000 UT) capsule Take 1 capsule (50,000 Units total) by mouth once a week.  . [DISCONTINUED] omeprazole (PRILOSEC) 20 MG capsule Take 20 mg by mouth 2 (two) times daily before a meal.   Allergies  Allergen Reactions  . Amoxicillin Rash   Recent Results (from the past 2160 hour(s))  Novel  Coronavirus, NAA ([facility])     Status: None   Collection Time: 07/11/19 12:00 AM   Specimen: Oropharyngeal(OP) collection in vial transport medium   OROPHARYNGEA  TESTING  Result Value Ref Range   SARS-CoV-2, NAA Not Detected Not Detected    Comment: This test was developed and its performance characteristics determined by [employer]. This test has not been FDA cleared or approved. This test has been authorized by FDA under an Emergency Use Authorization (EUA). This test is only authorized for the duration of time the declaration that circumstances exist justifying the authorization of the emergency use of in vitro diagnostic tests for detection of SARS-CoV-2 virus and/or diagnosis of COVID-19 infection under section 564(b)(1) of the Act, 21 U.S.C. 878MVE-7(M)(0), unless the authorization is terminated or revoked sooner. When diagnostic testing is negative, the possibility of a false negative result should be considered in the context of a patient's recent exposures and the presence of clinical signs and symptoms consistent with COVID-19. An individual without symptoms of COVID-19 and who is not shedding SARS-CoV-2 virus would expect to have a negative (not detected) result in this assay.    Objective  Body mass index is 32.32 kg/m. Wt Readings from Last 3 Encounters:  09/06/19 194 lb 3.2 oz (88.1 kg)  12/29/18 202 lb 12.8 oz (92 kg)  10/30/18 197 lb (89.4 kg)   Temp Readings from Last 3 Encounters:  09/06/19 97.9 F (36.6 C) (Oral)  12/29/18 97.8 F (36.6 C) (Oral)  09/28/18 97.9 F (36.6 C) (Oral)   BP Readings from Last 3 Encounters:  09/06/19 112/76  12/29/18 112/70  10/30/18 100/70   Pulse Readings from Last 3 Encounters:  09/06/19 (!) 58  12/29/18 74  09/28/18 73    Physical Exam Vitals signs and nursing note reviewed.  Constitutional:      Appearance: Normal appearance. She is well-developed and well-groomed. She is obese.     Comments: +mask on     HENT:     Head: Normocephalic and atraumatic.  Eyes:     Conjunctiva/sclera: Conjunctivae normal.     Pupils: Pupils are equal, round, and reactive to light.  Cardiovascular:     Rate and Rhythm: Normal rate and regular rhythm.     Heart sounds: Normal heart sounds. No murmur.  Pulmonary:     Effort: Pulmonary effort is normal.     Breath sounds: Normal breath sounds.  Abdominal:     General: Abdomen is flat. Bowel sounds are normal.     Tenderness: There is no abdominal tenderness.  Skin:    General: Skin is warm and dry.  Neurological:     General: No focal deficit present.     Mental Status: She is alert and oriented to person, place, and time. Mental status is at baseline.     Gait: Gait normal.  Psychiatric:        Attention and Perception: Attention and perception normal.        Mood and Affect: Mood and affect normal.        Speech: Speech normal.        Behavior: Behavior normal. Behavior is cooperative.        Thought Content: Thought content normal.        Cognition and Memory: Cognition and memory normal.        Judgment: Judgment normal.     Assessment  Plan  Annual physical exam Flu shotutdhad 08/01/19 Tdap maybe had in 2016    Declines STD check today h/o HPV and HSV since 20s  Pap OB/GYN Dr. Kenton Kingfisher sch 10/2018 s/p LEEPpap 10/30/18 neg neg HPV sch for f/u 11/05/19 ob/gyn   rec D3 2000 to 5000 IU daily    Gastroesophageal reflux disease, unspecified whether esophagitis present - Plan: omeprazole (PRILOSEC) 40 MG capsule pepcid qhs if needed and consider GI in future if not better  Bilateral carpal tunnel syndrome  Rx wrist brace/split given today  If not better let me know refer to neurology vs hand ortho     Provider: Dr. Olivia Mackie McLean-Scocuzza-Internal Medicine

## 2019-09-06 NOTE — Patient Instructions (Addendum)
Magnesium 250 to 500 mg daily  Excedrin migraine as needed  Rest 6-8 hours   pepcid ac at night (otc) if needed over the counter  Vitamin D3 2000 IU no more than 4000 Iu daily   Consider nerve conduction study with specialist  Consider GI Consider dermatology in future if rash comes back   Carpal Tunnel Syndrome  Carpal tunnel syndrome is a condition that causes pain in your hand and arm. The carpal tunnel is a narrow area located on the palm side of your wrist. Repeated wrist motion or certain diseases may cause swelling within the tunnel. This swelling pinches the main nerve in the wrist (median nerve). What are the causes? This condition may be caused by:  Repeated wrist motions.  Wrist injuries.  Arthritis.  A cyst or tumor in the carpal tunnel.  Fluid buildup during pregnancy. Sometimes the cause of this condition is not known. What increases the risk? The following factors may make you more likely to develop this condition:  Having a job, such as being a Haematologist, that requires you to repeatedly move your wrist in the same motion.  Being a woman.  Having certain conditions, such as: ? Diabetes. ? Obesity. ? An underactive thyroid (hypothyroidism). ? Kidney failure. What are the signs or symptoms? Symptoms of this condition include:  A tingling feeling in your fingers, especially in your thumb, index, and middle fingers.  Tingling or numbness in your hand.  An aching feeling in your entire arm, especially when your wrist and elbow are bent for a long time.  Wrist pain that goes up your arm to your shoulder.  Pain that goes down into your palm or fingers.  A weak feeling in your hands. You may have trouble grabbing and holding items. Your symptoms may feel worse during the night. How is this diagnosed? This condition is diagnosed with a medical history and physical exam. You may also have tests, including:  Electromyogram (EMG). This test  measures electrical signals sent by your nerves into the muscles.  Nerve conduction study. This test measures how well electrical signals pass through your nerves.  Imaging tests, such as X-rays, ultrasound, and MRI. These tests check for possible causes of your condition. How is this treated? This condition may be treated with:  Lifestyle changes. It is important to stop or change the activity that caused your condition.  Doing exercise and activities to strengthen your muscles and bones (physical therapy).  Learning how to use your hand again after diagnosis (occupational therapy).  Medicines for pain and inflammation. This may include medicine that is injected into your wrist.  A wrist splint.  Surgery. Follow these instructions at home: If you have a splint:  Wear the splint as told by your health care provider. Remove it only as told by your health care provider.  Loosen the splint if your fingers tingle, become numb, or turn cold and blue.  Keep the splint clean.  If the splint is not waterproof: ? Do not let it get wet. ? Cover it with a watertight covering when you take a bath or shower. Managing pain, stiffness, and swelling   If directed, put ice on the painful area: ? If you have a removable splint, remove it as told by your health care provider. ? Put ice in a plastic bag. ? Place a towel between your skin and the bag. ? Leave the ice on for 20 minutes, 2-3 times per day. General instructions  Take over-the-counter and prescription medicines only as told by your health care provider.  Rest your wrist from any activity that may be causing your pain. If your condition is work related, talk with your employer about changes that can be made, such as getting a wrist pad to use while typing.  Do any exercises as told by your health care provider, physical therapist, or occupational therapist.  Keep all follow-up visits as told by your health care provider. This is  important. Contact a health care provider if:  You have new symptoms.  Your pain is not controlled with medicines.  Your symptoms get worse. Get help right away if:  You have severe numbness or tingling in your wrist or hand. Summary  Carpal tunnel syndrome is a condition that causes pain in your hand and arm.  It is usually caused by repeated wrist motions.  Lifestyle changes and medicines are used to treat carpal tunnel syndrome. Surgery may be recommended.  Follow your health care provider's instructions about wearing a splint, resting from activity, keeping follow-up visits, and calling for help. This information is not intended to replace advice given to you by your health care provider. Make sure you discuss any questions you have with your health care provider. Document Released: 10/29/2000 Document Revised: 03/10/2018 Document Reviewed: 03/10/2018 Elsevier Patient Education  2020 [employer]    Gastroesophageal Reflux Disease, Adult Gastroesophageal reflux (GER) happens when acid from the stomach flows up into the tube that connects the mouth and the stomach (esophagus). Normally, food travels down the esophagus and stays in the stomach to be digested. However, when a person has GER, food and stomach acid sometimes move back up into the esophagus. If this becomes a more serious problem, the person may be diagnosed with a disease called gastroesophageal reflux disease (GERD). GERD occurs when the reflux:  Happens often.  Causes frequent or severe symptoms.  Causes problems such as damage to the esophagus. When stomach acid comes in contact with the esophagus, the acid may cause soreness (inflammation) in the esophagus. Over time, GERD may create small holes (ulcers) in the lining of the esophagus. What are the causes? This condition is caused by a problem with the muscle between the esophagus and the stomach (lower esophageal sphincter, or LES). Normally, the LES muscle  closes after food passes through the esophagus to the stomach. When the LES is weakened or abnormal, it does not close properly, and that allows food and stomach acid to go back up into the esophagus. The LES can be weakened by certain dietary substances, medicines, and medical conditions, including:  Tobacco use.  Pregnancy.  Having a hiatal hernia.  Alcohol use.  Certain foods and beverages, such as coffee, chocolate, onions, and peppermint. What increases the risk? You are more likely to develop this condition if you:  Have an increased body weight.  Have a connective tissue disorder.  Use NSAID medicines. What are the signs or symptoms? Symptoms of this condition include:  Heartburn.  Difficult or painful swallowing.  The feeling of having a lump in the throat.  Abitter taste in the mouth.  Bad breath.  Having a large amount of saliva.  Having an upset or bloated stomach.  Belching.  Chest pain. Different conditions can cause chest pain. Make sure you see your health care provider if you experience chest pain.  Shortness of breath or wheezing.  Ongoing (chronic) cough or a night-time cough.  Wearing away of tooth enamel.  Weight loss.  How is this diagnosed? Your health care provider will take a medical history and perform a physical exam. To determine if you have mild or severe GERD, your health care provider may also monitor how you respond to treatment. You may also have tests, including:  A test to examine your stomach and esophagus with a small camera (endoscopy).  A test thatmeasures the acidity level in your esophagus.  A test thatmeasures how much pressure is on your esophagus.  A barium swallow or modified barium swallow test to show the shape, size, and functioning of your esophagus. How is this treated? The goal of treatment is to help relieve your symptoms and to prevent complications. Treatment for this condition may vary depending on how  severe your symptoms are. Your health care provider may recommend:  Changes to your diet.  Medicine.  Surgery. Follow these instructions at home: Eating and drinking   Follow a diet as recommended by your health care provider. This may involve avoiding foods and drinks such as: ? Coffee and tea (with or without caffeine). ? Drinks that containalcohol. ? Energy drinks and sports drinks. ? Carbonated drinks or sodas. ? Chocolate and cocoa. ? Peppermint and mint flavorings. ? Garlic and onions. ? Horseradish. ? Spicy and acidic foods, including peppers, chili powder, curry powder, vinegar, hot sauces, and barbecue sauce. ? Citrus fruit juices and citrus fruits, such as oranges, lemons, and limes. ? Tomato-based foods, such as red sauce, chili, salsa, and pizza with red sauce. ? Fried and fatty foods, such as donuts, french fries, potato chips, and high-fat dressings. ? High-fat meats, such as hot dogs and fatty cuts of red and white meats, such as rib eye steak, sausage, ham, and bacon. ? High-fat dairy items, such as whole milk, butter, and cream cheese.  Eat small, frequent meals instead of large meals.  Avoid drinking large amounts of liquid with your meals.  Avoid eating meals during the 2-3 hours before bedtime.  Avoid lying down right after you eat.  Do not exercise right after you eat. Lifestyle   Do not use any products that contain nicotine or tobacco, such as cigarettes, e-cigarettes, and chewing tobacco. If you need help quitting, ask your health care provider.  Try to reduce your stress by using methods such as yoga or meditation. If you need help reducing stress, ask your health care provider.  If you are overweight, reduce your weight to an amount that is healthy for you. Ask your health care provider for guidance about a safe weight loss goal. General instructions  Pay attention to any changes in your symptoms.  Take over-the-counter and prescription  medicines only as told by your health care provider. Do not take aspirin, ibuprofen, or other NSAIDs unless your health care provider told you to do so.  Wear loose-fitting clothing. Do not wear anything tight around your waist that causes pressure on your abdomen.  Raise (elevate) the head of your bed about 6 inches (15 cm).  Avoid bending over if this makes your symptoms worse.  Keep all follow-up visits as told by your health care provider. This is important. Contact a health care provider if:  You have: ? New symptoms. ? Unexplained weight loss. ? Difficulty swallowing or it hurts to swallow. ? Wheezing or a persistent cough. ? A hoarse voice.  Your symptoms do not improve with treatment. Get help right away if you:  Have pain in your arms, neck, jaw, teeth, or back.  Feel sweaty, dizzy,  or light-headed.  Have chest pain or shortness of breath.  Vomit and your vomit looks like blood or coffee grounds.  Faint.  Have stool that is bloody or black.  Cannot swallow, drink, or eat. Summary  Gastroesophageal reflux happens when acid from the stomach flows up into the esophagus. GERD is a disease in which the reflux happens often, causes frequent or severe symptoms, or causes problems such as damage to the esophagus.  Treatment for this condition may vary depending on how severe your symptoms are. Your health care provider may recommend diet and lifestyle changes, medicine, or surgery.  Contact a health care provider if you have new or worsening symptoms.  Take over-the-counter and prescription medicines only as told by your health care provider. Do not take aspirin, ibuprofen, or other NSAIDs unless your health care provider told you to do so.  Keep all follow-up visits as told by your health care provider. This is important. This information is not intended to replace advice given to you by your health care provider. Make sure you discuss any questions you have with your  health care provider. Document Released: 08/11/2005 Document Revised: 05/10/2018 Document Reviewed: 05/10/2018 Elsevier Patient Education  2020 [employer].  Food Choices for Gastroesophageal Reflux Disease, Adult When you have gastroesophageal reflux disease (GERD), the foods you eat and your eating habits are very important. Choosing the right foods can help ease the discomfort of GERD. Consider working with a diet and nutrition specialist (dietitian) to help you make healthy food choices. What general guidelines should I follow?  Eating plan  Choose healthy foods low in fat, such as fruits, vegetables, whole grains, low-fat dairy products, and lean meat, fish, and poultry.  Eat frequent, small meals instead of three large meals each day. Eat your meals slowly, in a relaxed setting. Avoid bending over or lying down until 2-3 hours after eating.  Limit high-fat foods such as fatty meats or fried foods.  Limit your intake of oils, butter, and shortening to less than 8 teaspoons each day.  Avoid the following: ? Foods that cause symptoms. These may be different for different people. Keep a food diary to keep track of foods that cause symptoms. ? Alcohol. ? Drinking large amounts of liquid with meals. ? Eating meals during the 2-3 hours before bed.  Cook foods using methods other than frying. This may include baking, grilling, or broiling. Lifestyle  Maintain a healthy weight. Ask your health care provider what weight is healthy for you. If you need to lose weight, work with your health care provider to do so safely.  Exercise for at least 30 minutes on 5 or more days each week, or as told by your health care provider.  Avoid wearing clothes that fit tightly around your waist and chest.  Do not use any products that contain nicotine or tobacco, such as cigarettes and e-cigarettes. If you need help quitting, ask your health care provider.  Sleep with the head of your bed raised. Use  a wedge under the mattress or blocks under the bed frame to raise the head of the bed. What foods are not recommended? The items listed may not be a complete list. Talk with your dietitian about what dietary choices are best for you. Grains Pastries or quick breads with added fat. [place] toast. Vegetables Deep fried vegetables. [place] fries. Any vegetables prepared with added fat. Any vegetables that cause symptoms. For some people this may include tomatoes and tomato products, chili peppers,  onions and garlic, and horseradish. Fruits Any fruits prepared with added fat. Any fruits that cause symptoms. For some people this may include citrus fruits, such as oranges, grapefruit, pineapple, and lemons. Meats and other protein foods High-fat meats, such as fatty beef or pork, hot dogs, ribs, ham, sausage, salami and bacon. Fried meat or protein, including fried fish and fried chicken. Nuts and nut butters. Dairy Whole milk and chocolate milk. Sour cream. Cream. Ice cream. Cream cheese. Milk shakes. Beverages Coffee and tea, with or without caffeine. Carbonated beverages. Sodas. Energy drinks. Fruit juice made with acidic fruits (such as orange or grapefruit). Tomato juice. Alcoholic drinks. Fats and oils Butter. Margarine. Shortening. Ghee. Sweets and desserts Chocolate and cocoa. Donuts. Seasoning and other foods Pepper. Peppermint and spearmint. Any condiments, herbs, or seasonings that cause symptoms. For some people, this may include curry, hot sauce, or vinegar-based salad dressings. Summary  When you have gastroesophageal reflux disease (GERD), food and lifestyle choices are very important to help ease the discomfort of GERD.  Eat frequent, small meals instead of three large meals each day. Eat your meals slowly, in a relaxed setting. Avoid bending over or lying down until 2-3 hours after eating.  Limit high-fat foods such as fatty meat or fried foods. This information is not intended  to replace advice given to you by your health care provider. Make sure you discuss any questions you have with your health care provider. Document Released: 11/01/2005 Document Revised: 02/22/2019 Document Reviewed: 11/02/2016 Elsevier Patient Education  2020 [employer].  Migraine Headache A migraine headache is an intense, throbbing pain on one side or both sides of the head. Migraine headaches may also cause other symptoms, such as nausea, vomiting, and sensitivity to light and noise. A migraine headache can last from 4 hours to 3 days. Talk with your doctor about what things may bring on (trigger) your migraine headaches. What are the causes? The exact cause of this condition is not known. However, a migraine may be caused when nerves in the brain become irritated and release chemicals that cause inflammation of blood vessels. This inflammation causes pain. This condition may be triggered or caused by:  Drinking alcohol.  Smoking.  Taking medicines, such as: ? Medicine used to treat chest pain (nitroglycerin). ? Birth control pills. ? Estrogen. ? Certain blood pressure medicines.  Eating or drinking products that contain nitrates, glutamate, aspartame, or tyramine. Aged cheeses, chocolate, or caffeine may also be triggers.  Doing physical activity. Other things that may trigger a migraine headache include:  Menstruation.  Pregnancy.  Hunger.  Stress.  Lack of sleep or too much sleep.  Weather changes.  Fatigue. What increases the risk? The following factors may make you more likely to experience migraine headaches:  Being a certain age. This condition is more common in people who are 13-72 years old.  Being female.  Having a family history of migraine headaches.  Being Caucasian.  Having a mental health condition, such as depression or anxiety.  Being obese. What are the signs or symptoms? The main symptom of this condition is pulsating or throbbing pain.  This pain may:  Happen in any area of the head, such as on one side or both sides.  Interfere with daily activities.  Get worse with physical activity.  Get worse with exposure to bright lights or loud noises. Other symptoms may include:  Nausea.  Vomiting.  Dizziness.  General sensitivity to bright lights, loud noises, or smells. Before you get  a migraine headache, you may get warning signs (an aura). An aura may include:  Seeing flashing lights or having blind spots.  Seeing bright spots, halos, or zigzag lines.  Having tunnel vision or blurred vision.  Having numbness or a tingling feeling.  Having trouble talking.  Having muscle weakness. Some people have symptoms after a migraine headache (postdromal phase), such as:  Feeling tired.  Difficulty concentrating. How is this diagnosed? A migraine headache can be diagnosed based on:  Your symptoms.  A physical exam.  Tests, such as: ? CT scan or an MRI of the head. These imaging tests can help rule out other causes of headaches. ? Taking fluid from the spine (lumbar puncture) and analyzing it (cerebrospinal fluid analysis, or CSF analysis). How is this treated? This condition may be treated with medicines that:  Relieve pain.  Relieve nausea.  Prevent migraine headaches. Treatment for this condition may also include:  Acupuncture.  Lifestyle changes like avoiding foods that trigger migraine headaches.  Biofeedback.  Cognitive behavioral therapy. Follow these instructions at home: Medicines  Take over-the-counter and prescription medicines only as told by your health care provider.  Ask your health care provider if the medicine prescribed to you: ? Requires you to avoid driving or using heavy machinery. ? Can cause constipation. You may need to take these actions to prevent or treat constipation:  Drink enough fluid to keep your urine pale yellow.  Take over-the-counter or prescription  medicines.  Eat foods that are high in fiber, such as beans, whole grains, and fresh fruits and vegetables.  Limit foods that are high in fat and processed sugars, such as fried or sweet foods. Lifestyle  Do not drink alcohol.  Do not use any products that contain nicotine or tobacco, such as cigarettes, e-cigarettes, and chewing tobacco. If you need help quitting, ask your health care provider.  Get at least 8 hours of sleep every night.  Find ways to manage stress, such as meditation, deep breathing, or yoga. General instructions      Keep a journal to find out what may trigger your migraine headaches. For example, write down: ? What you eat and drink. ? How much sleep you get. ? Any change to your diet or medicines.  If you have a migraine headache: ? Avoid things that make your symptoms worse, such as bright lights. ? It may help to lie down in a dark, quiet room. ? Do not drive or use heavy machinery. ? Ask your health care provider what activities are safe for you while you are experiencing symptoms.  Keep all follow-up visits as told by your health care provider. This is important. Contact a health care provider if:  You develop symptoms that are different or more severe than your usual migraine headache symptoms.  You have more than 15 headache days in one month. Get help right away if:  Your migraine headache becomes severe.  Your migraine headache lasts longer than 72 hours.  You have a fever.  You have a stiff neck.  You have vision loss.  Your muscles feel weak or like you cannot control them.  You start to lose your balance often.  You have trouble walking.  You faint.  You have a seizure. Summary  A migraine headache is an intense, throbbing pain on one side or both sides of the head. Migraines may also cause other symptoms, such as nausea, vomiting, and sensitivity to light and noise.  This condition may be treated  with medicines and  lifestyle changes. You may also need to avoid certain things that trigger a migraine headache.  Keep a journal to find out what may trigger your migraine headaches.  Contact your health care provider if you have more than 15 headache days in a month or you develop symptoms that are different or more severe than your usual migraine headache symptoms. This information is not intended to replace advice given to you by your health care provider. Make sure you discuss any questions you have with your health care provider. Document Released: 11/01/2005 Document Revised: 02/23/2019 Document Reviewed: 12/14/2018 Elsevier Patient Education  2020 [employer]

## 2019-09-07 DIAGNOSIS — Z0000 Encounter for general adult medical examination without abnormal findings: Secondary | ICD-10-CM

## 2019-09-07 HISTORY — DX: Encounter for general adult medical examination without abnormal findings: Z00.00

## 2019-11-05 ENCOUNTER — Other Ambulatory Visit: Payer: Self-pay

## 2019-11-05 ENCOUNTER — Ambulatory Visit (INDEPENDENT_AMBULATORY_CARE_PROVIDER_SITE_OTHER): Payer: BC Managed Care – PPO | Admitting: Obstetrics & Gynecology

## 2019-11-05 ENCOUNTER — Encounter: Payer: Self-pay | Admitting: Obstetrics & Gynecology

## 2019-11-05 VITALS — BP 100/70 | Temp 96.2°F | Ht 65.0 in | Wt 193.0 lb

## 2019-11-05 DIAGNOSIS — Z30432 Encounter for removal of intrauterine contraceptive device: Secondary | ICD-10-CM | POA: Diagnosis not present

## 2019-11-05 DIAGNOSIS — Z01419 Encounter for gynecological examination (general) (routine) without abnormal findings: Secondary | ICD-10-CM | POA: Diagnosis not present

## 2019-11-05 NOTE — Progress Notes (Signed)
HPI:      Emily Griffin is a 33 y.o. G2P1001 who LMP was Patient's last menstrual period was 11/04/2019., she presents today for her annual examination. The patient has no complaints today other than continued monthly bleeding and even clumpy tar colored discharge at end of period; no pain. The patient is not currently sexually active for 4 years, no plans. Her last pap: approximate date 2019 and was normal.  2009 LEEP.  No recent abn PAP. The patient does perform self breast exams.  There is no notable family history of breast or ovarian cancer in her family.  The patient has regular exercise: yes.  The patient denies current symptoms of depression.    GYN History: Contraception: IUD  PMHx: Past Medical History:  Diagnosis Date  . Abnormal Pap smear of cervix   . DVT (deep venous thrombosis) (HCC) 2014   right leg was on OCP  . Migraine    in college   . STD (female)    HSV, HPV   Past Surgical History:  Procedure Laterality Date  . CERVICAL BIOPSY  W/ LOOP ELECTRODE EXCISION  2009   Family History  Problem Relation Age of Onset  . Miscarriages / [place] Mother   . Edema Mother   . Hypertension Father   . Asthma Brother   . Edema Brother        lymphedema  . Peripheral Artery Disease Maternal Grandmother        with stent  . Alcohol abuse Maternal Grandfather   . Cancer Maternal Grandfather        lung cancer mets smoker  . Alcohol abuse Paternal Grandfather   . Cancer Paternal Grandfather        pancreatitc   . Hypertension Paternal Grandfather    Social History   Tobacco Use  . Smoking status: Never Smoker  . Smokeless tobacco: Never Used  Substance Use Topics  . Alcohol use: No    Alcohol/week: 0.0 standard drinks  . Drug use: No    Current Outpatient Medications:  .  Cetirizine HCl 10 MG CAPS, Take 10 mg by mouth as needed. Reported on 11/03/2015, Disp: , Rfl:  .  fluticasone (FLONASE) 50 MCG/ACT nasal spray, Place into both nostrils daily., Disp: ,  Rfl:  .  omeprazole (PRILOSEC) 40 MG capsule, Take 1 capsule (40 mg total) by mouth daily. 30 minutes, Disp: 90 capsule, Rfl: 3 .  VITAMIN D PO, Take by mouth., Disp: , Rfl:  Allergies: Amoxicillin  Review of Systems  Constitutional: Negative for chills, fever and malaise/fatigue.  HENT: Negative for congestion, sinus pain and sore throat.   Eyes: Negative for blurred vision and pain.  Respiratory: Negative for cough and wheezing.   Cardiovascular: Negative for chest pain and leg swelling.  Gastrointestinal: Negative for abdominal pain, constipation, diarrhea, heartburn, nausea and vomiting.  Genitourinary: Negative for dysuria, frequency, hematuria and urgency.  Musculoskeletal: Negative for back pain, joint pain, myalgias and neck pain.  Skin: Negative for itching and rash.  Neurological: Negative for dizziness, tremors and weakness.  Endo/Heme/Allergies: Does not bruise/bleed easily.  Psychiatric/Behavioral: Negative for depression. The patient is not nervous/anxious and does not have insomnia.     Objective: BP 100/70   Temp (!) 96.2 F (35.7 C)   Ht 5\' 5"  (1.651 m)   Wt 193 lb (87.5 kg)   LMP 11/04/2019   BMI 32.12 kg/m   Filed Weights   11/05/19 1526  Weight: 193 lb (87.5 kg)  Body mass index is 32.12 kg/m. Physical Exam Constitutional:      General: She is not in acute distress.    Appearance: She is well-developed.  Genitourinary:     Pelvic exam was performed with patient supine.     Vagina, uterus and rectum normal.     No lesions in the vagina.     No vaginal bleeding.     No cervical motion tenderness, friability, lesion or polyp.     IUD strings visualized.     Uterus is mobile.     Uterus is not enlarged.     No uterine mass detected.    Uterus is midaxial.     No right or left adnexal mass present.     Right adnexa not tender.     Left adnexa not tender.  HENT:     Head: Normocephalic and atraumatic. No laceration.     Right Ear: Hearing normal.       Left Ear: Hearing normal.     Mouth/Throat:     Pharynx: Uvula midline.  Eyes:     Pupils: Pupils are equal, round, and reactive to light.  Neck:     Thyroid: No thyromegaly.  Cardiovascular:     Rate and Rhythm: Normal rate and regular rhythm.     Heart sounds: No murmur. No friction rub. No gallop.   Pulmonary:     Effort: Pulmonary effort is normal. No respiratory distress.     Breath sounds: Normal breath sounds. No wheezing.  Chest:     Breasts:        Right: No mass, skin change or tenderness.        Left: No mass, skin change or tenderness.  Abdominal:     General: Bowel sounds are normal. There is no distension.     Palpations: Abdomen is soft.     Tenderness: There is no abdominal tenderness. There is no rebound.  Musculoskeletal:        General: Normal range of motion.     Cervical back: Normal range of motion and neck supple.  Neurological:     Mental Status: She is alert and oriented to person, place, and time.     Cranial Nerves: No cranial nerve deficit.  Skin:    General: Skin is warm and dry.  Psychiatric:        Judgment: Judgment normal.  Vitals reviewed.    Assessment:  ANNUAL EXAM 1. Women's annual routine gynecological examination   2. Encounter for IUD removal     Screening Plan:            1.  Cervical Screening-  Pap smear schedule reviewed with patient  2. Breast screening- Exam annually and mammogram>40 planned  3. Labs managed by PCP  4. Counseling for contraception: abstinence   5. Encounter for IUD removal  IUD Removal Strings of IUD identified and grasped.  IUD removed without problem.  Pt tolerated this well.  IUD noted to be intact.  IUD removed and plan for contraception is abstinence. She was amenable to this plan.    F/U  Return in about 1 year (around 11/04/2020) for Annual.  Barnett Applebaum, MD, [facility], [facility] 11/05/2019  4:16 PM

## 2020-03-06 ENCOUNTER — Telehealth (INDEPENDENT_AMBULATORY_CARE_PROVIDER_SITE_OTHER): Payer: BC Managed Care – PPO | Admitting: Internal Medicine

## 2020-03-06 ENCOUNTER — Encounter: Payer: Self-pay | Admitting: Internal Medicine

## 2020-03-06 VITALS — Ht 65.0 in | Wt 205.0 lb

## 2020-03-06 DIAGNOSIS — Z0000 Encounter for general adult medical examination without abnormal findings: Secondary | ICD-10-CM | POA: Diagnosis not present

## 2020-03-06 DIAGNOSIS — G5603 Carpal tunnel syndrome, bilateral upper limbs: Secondary | ICD-10-CM | POA: Diagnosis not present

## 2020-03-06 DIAGNOSIS — E669 Obesity, unspecified: Secondary | ICD-10-CM | POA: Diagnosis not present

## 2020-03-06 DIAGNOSIS — Z1329 Encounter for screening for other suspected endocrine disorder: Secondary | ICD-10-CM

## 2020-03-06 DIAGNOSIS — Z13220 Encounter for screening for lipoid disorders: Secondary | ICD-10-CM

## 2020-03-06 NOTE — Progress Notes (Signed)
Virtual Visit via Video Note  I connected with Emily Griffin  on 03/06/20 at  4:15 PM EDT by a video enabled telemedicine application and verified that I am speaking with the correct person using two identifiers.  Location patient: home Location provider:work or home office Persons participating in the virtual visit: patient, provider  I discussed the limitations of evaluation and management by telemedicine and the availability of in person appointments. The patient expressed understanding and agreed to proceed.   HPI: 1. Obesity eating at home some options not the most healthy wt was down to 180 but went up to 190s-195 this month now 205. She was working on [employer] bike/[place] track but has not been doing as much has done detox tea to cleanse recently which did not help  2. C/o b/l hand numbness/tingling R>L 2-3x per week  C/w CTS mom and GM both have had this She thought this was due to generic zyrtec so she stopped this med reviewed AE <2% numbness/tingling due to med   ROS: See pertinent positives and negatives per HPI.  Past Medical History:  Diagnosis Date  . Abnormal Pap smear of cervix   . DVT (deep venous thrombosis) (HCC) 2014   right leg was on OCP  . Migraine    in college   . STD (female)    HSV, HPV    Past Surgical History:  Procedure Laterality Date  . CERVICAL BIOPSY  W/ LOOP ELECTRODE EXCISION  2009    Family History  Problem Relation Age of Onset  . Miscarriages / [place] Mother   . Edema Mother   . Hypertension Father   . Asthma Brother   . Edema Brother        lymphedema  . Peripheral Artery Disease Maternal Grandmother        with stent  . Alcohol abuse Maternal Grandfather   . Cancer Maternal Grandfather        lung cancer mets smoker  . Alcohol abuse Paternal Grandfather   . Cancer Paternal Grandfather        pancreatitc   . Hypertension Paternal Grandfather     SOCIAL HX: lives at home with daughter, parents  Current Outpatient  Medications:  .  Cetirizine HCl 10 MG CAPS, Take 10 mg by mouth as needed. Reported on 11/03/2015, Disp: , Rfl:  .  fluticasone (FLONASE) 50 MCG/ACT nasal spray, Place into both nostrils daily., Disp: , Rfl:  .  omeprazole (PRILOSEC) 40 MG capsule, Take 1 capsule (40 mg total) by mouth daily. 30 minutes, Disp: 90 capsule, Rfl: 3 .  VITAMIN D PO, Take by mouth., Disp: , Rfl:   EXAM:  VITALS per patient if applicable:  GENERAL: alert, oriented, appears well and in no acute distress  HEENT: atraumatic, conjunttiva clear, no obvious abnormalities on inspection of external nose and ears  NECK: normal movements of the head and neck  LUNGS: on inspection no signs of respiratory distress, breathing rate appears normal, no obvious gross SOB, gasping or wheezing  CV: no obvious cyanosis  MS: moves all visible extremities without noticeable abnormality  PSYCH/NEURO: pleasant and cooperative, no obvious depression or anxiety, speech and thought processing grossly intact  ASSESSMENT AND PLAN:  Discussed the following assessment and plan:  Bilateral carpal tunnel syndrome - Plan: Ambulatory referral to Orthopedic Surgery Dr. Amanda Pea for NCS/EMG b/l hands R>L   Obesity (BMI 30-39.9) rec healthy diet and exercise if EMG/NCS + consider Adipex 1/2 dose 37.5   HM Flu shotutdhad  08/01/19 Tdap maybe had in 2016  covid vx 2/2 pfizer   Declines STD check today h/o HPV and HSV since 20s  Pap OB/GYN Dr. Kenton Kingfisher sch 10/2018 s/p LEEPpap 10/30/18 neg neg HPV sch for f/u 11/05/19 ob/gyn   rec D3 2000 to 5000 IU daily   -we discussed possible serious and likely etiologies, options for evaluation and workup, limitations of telemedicine visit vs in person visit, treatment, treatment risks and precautions. Pt prefers to treat via telemedicine empirically rather then risking or undertaking an in person visit at this moment. Patient agrees to seek prompt in person care if worsening, new symptoms arise,  or if is not improving with treatment.   I discussed the assessment and treatment plan with the patient. The patient was provided an opportunity to ask questions and all were answered. The patient agreed with the plan and demonstrated an understanding of the instructions.   The patient was advised to call back or seek an in-person evaluation if the symptoms worsen or if the condition fails to improve as anticipated.  Time spent 20 minutes  Delorise Jackson, MD

## 2020-03-07 DIAGNOSIS — E669 Obesity, unspecified: Secondary | ICD-10-CM | POA: Insufficient documentation

## 2020-03-12 ENCOUNTER — Telehealth: Payer: Self-pay | Admitting: Internal Medicine

## 2020-03-12 NOTE — Telephone Encounter (Signed)
Mailed lab forms to patient to have done at [facility] around 06/07/20.   Patient informed and verbalized understanding and verified address.

## 2020-05-22 DIAGNOSIS — M79641 Pain in right hand: Secondary | ICD-10-CM | POA: Diagnosis not present

## 2020-05-22 DIAGNOSIS — G5603 Carpal tunnel syndrome, bilateral upper limbs: Secondary | ICD-10-CM | POA: Diagnosis not present

## 2020-05-22 DIAGNOSIS — M79642 Pain in left hand: Secondary | ICD-10-CM | POA: Diagnosis not present

## 2020-05-22 HISTORY — DX: Carpal tunnel syndrome, bilateral upper limbs: G56.03

## 2020-06-01 ENCOUNTER — Encounter: Payer: Self-pay | Admitting: Internal Medicine

## 2020-06-01 DIAGNOSIS — G5603 Carpal tunnel syndrome, bilateral upper limbs: Secondary | ICD-10-CM | POA: Insufficient documentation

## 2020-06-05 IMAGING — US US EXTREM LOW VENOUS BILAT
1 series · 13 of 24 positions shown · non-contrast
Comparison: Right lower extremity venous Doppler
ultrasound-01/15/2017; 09/11/2015

CLINICAL DATA: Bilateral lower extremity pain and edema. History
prior right lower extremity DVT. Evaluate for acute or chronic DVT.



[Series 1: us extrem low venous bilat · 13 of 60 slices shown]
[im 1/60]
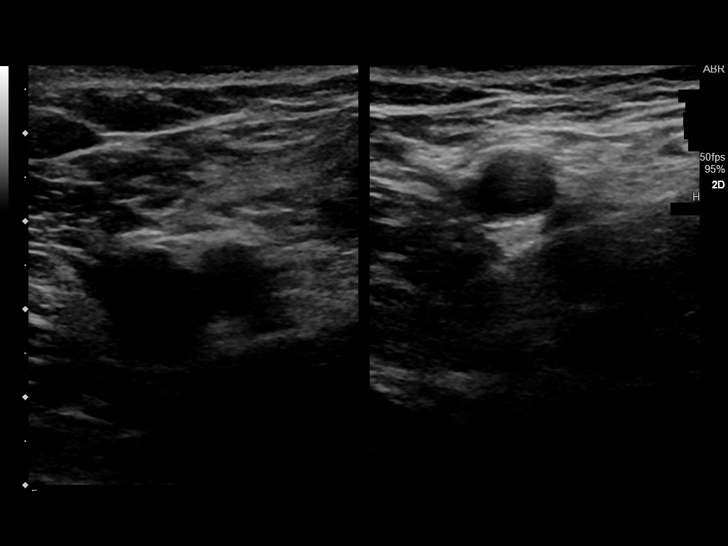
[im 6/60]
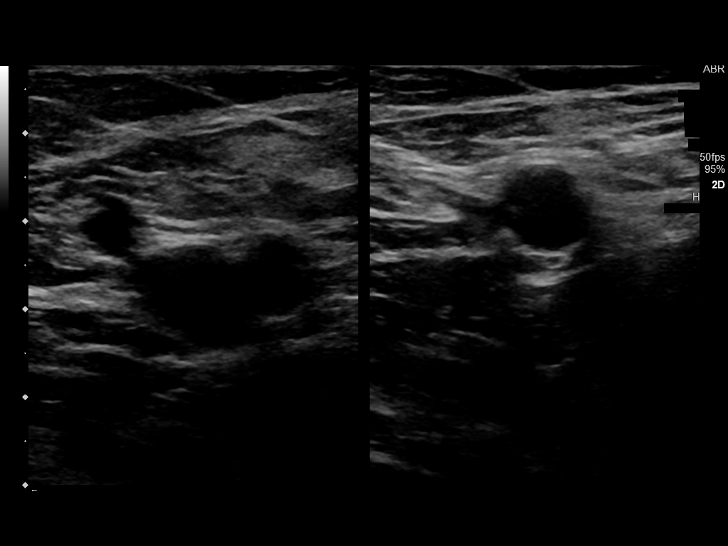
[im 11/60]
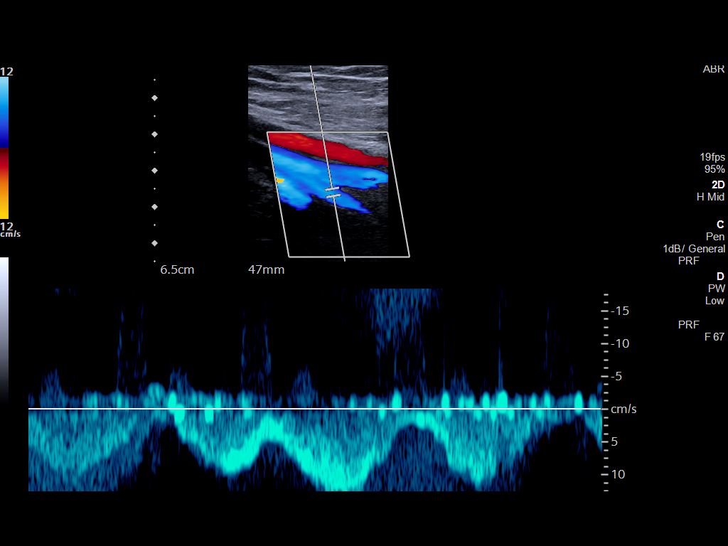
[im 16/60]
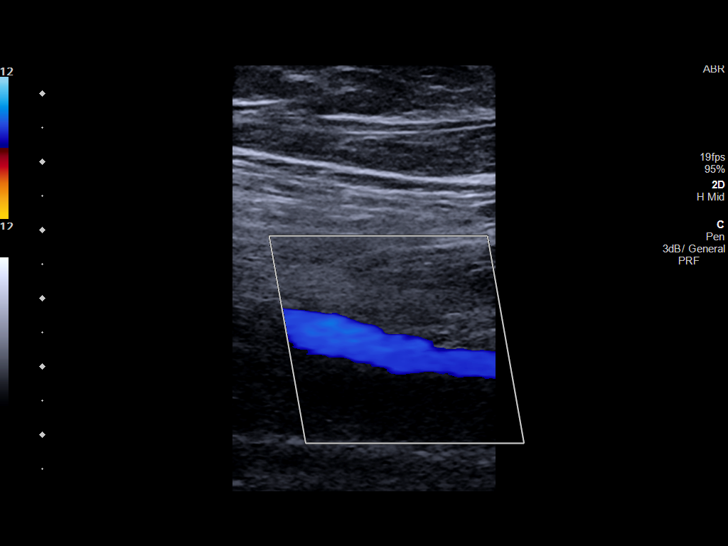
[im 21/60]
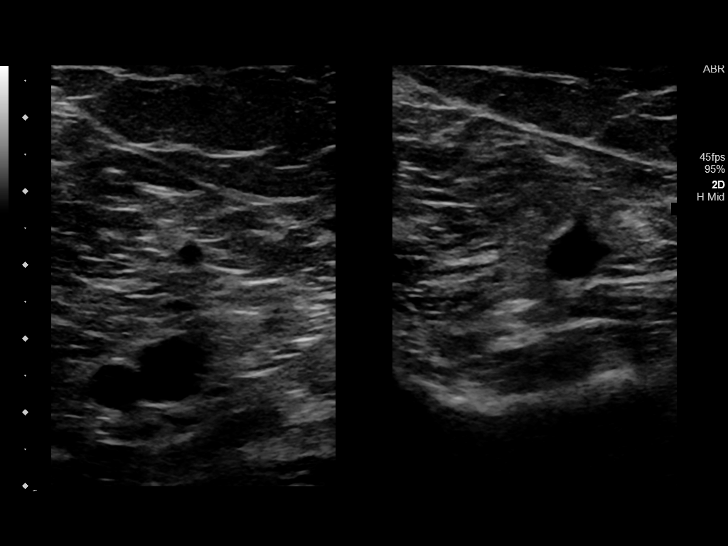
[im 26/60]
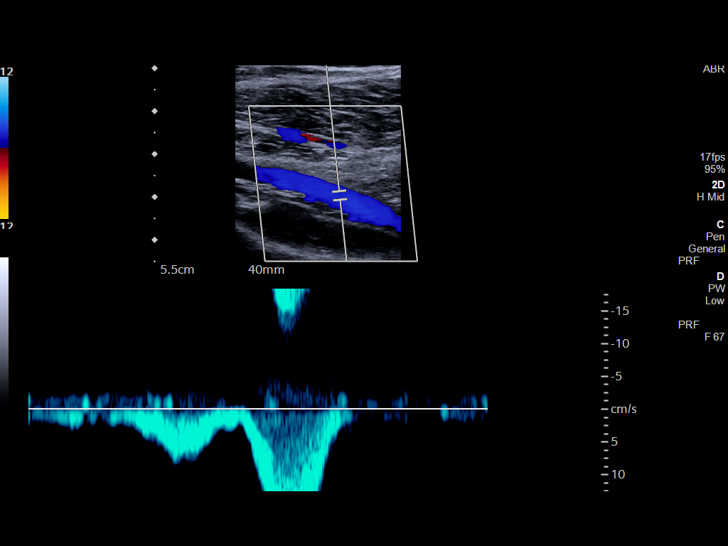
[im 31/60]
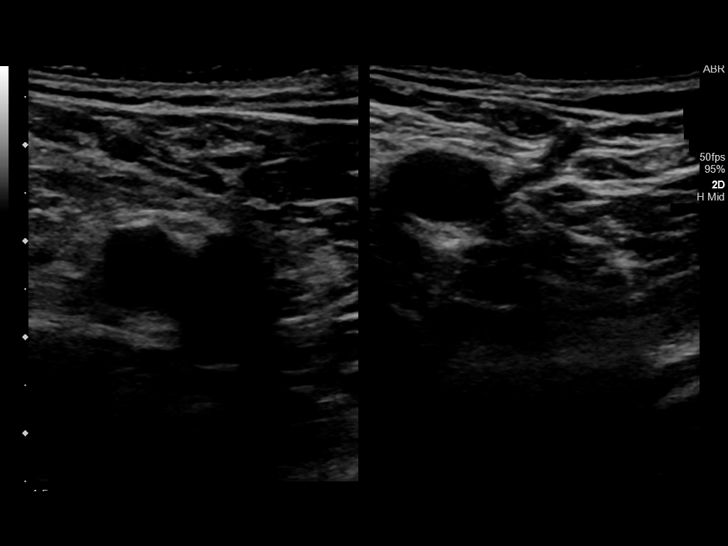
[im 34/60]
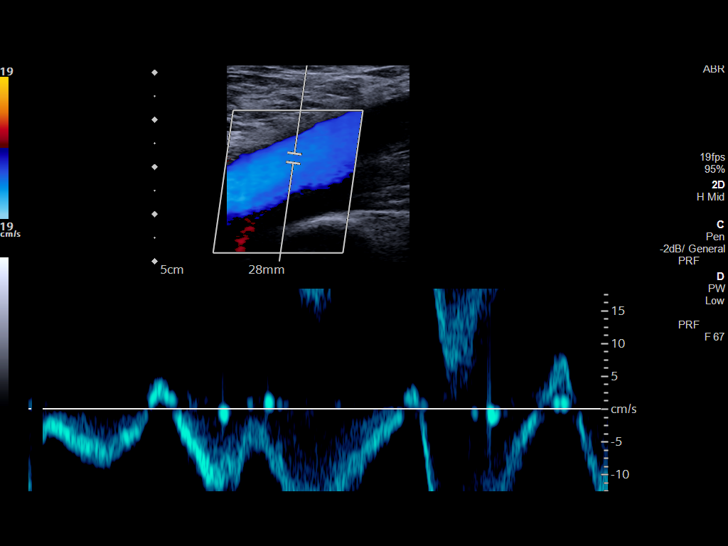
[im 39/60]
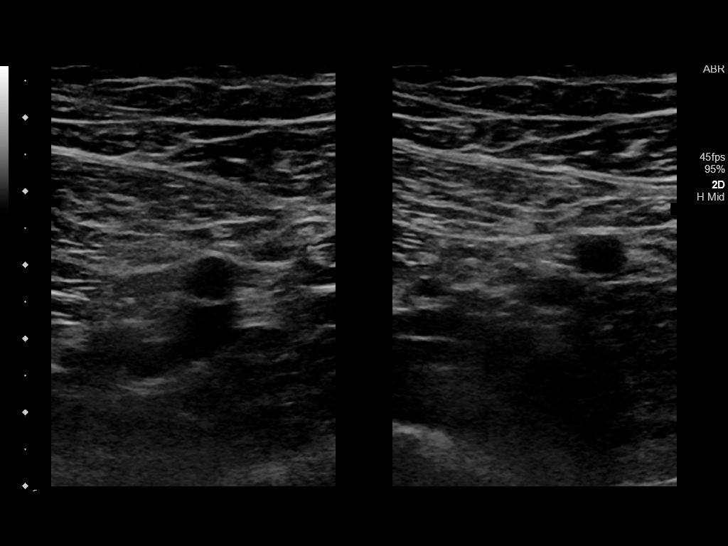
[im 44/60]
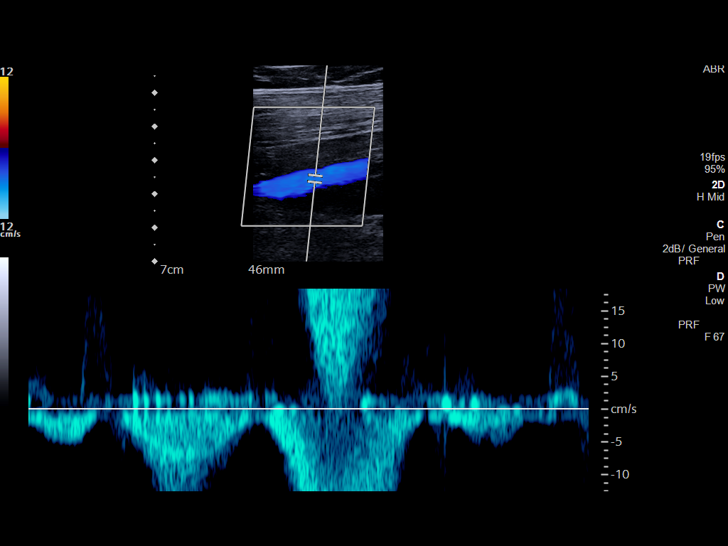
[im 49/60]
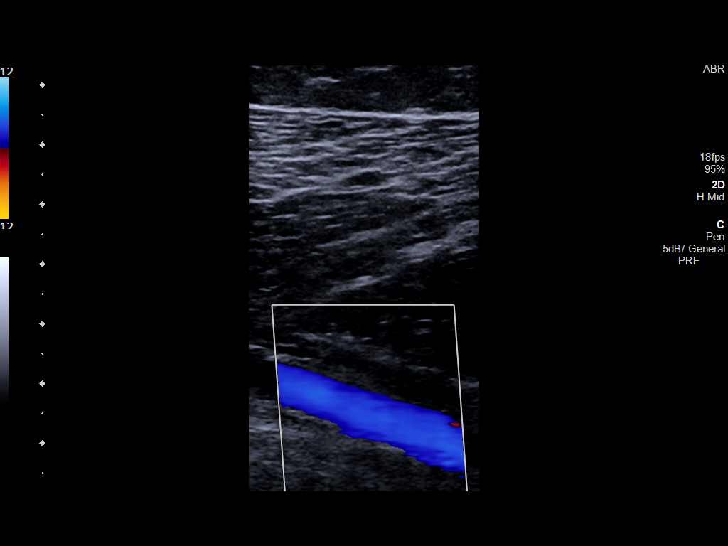
[im 54/60]
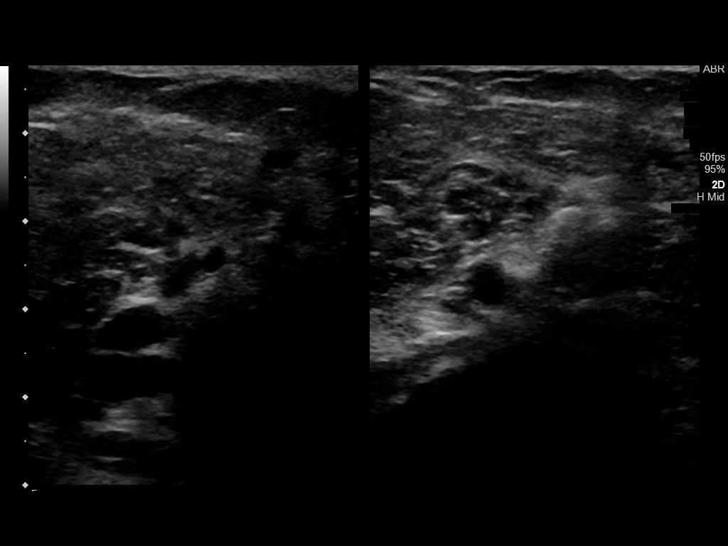
[im 60/60]
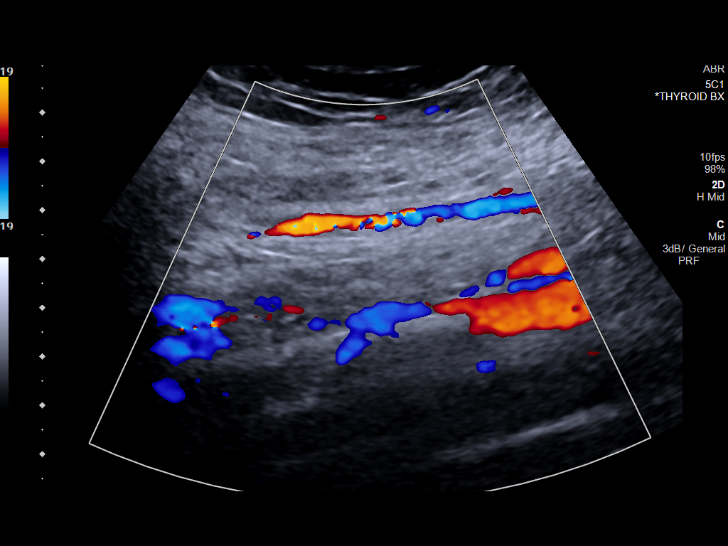

[13 of 24 positions shown; findings below may reference images not displayed]

FINDINGS: RIGHT LOWER EXTREMITY

Common Femoral Vein: No evidence of thrombus. Normal
compressibility, respiratory phasicity and response to augmentation.

Saphenofemoral Junction: No evidence of thrombus. Normal
compressibility and flow on color Doppler imaging.

Profunda Femoral Vein: No evidence of thrombus. Normal
compressibility and flow on color Doppler imaging.

Femoral Vein: No evidence of thrombus. Normal compressibility,
respiratory phasicity and response to augmentation.

Popliteal Vein: No evidence of thrombus. Normal compressibility,
respiratory phasicity and response to augmentation.

Calf Veins: No evidence of thrombus. Normal compressibility and flow
on color Doppler imaging.

Superficial Great Saphenous Vein: No evidence of thrombus. Normal
compressibility.

Venous Reflux:  None.

Other Findings:  None.

LEFT LOWER EXTREMITY

Common Femoral Vein: No evidence of thrombus. Normal
compressibility, respiratory phasicity and response to augmentation.

Saphenofemoral Junction: No evidence of thrombus. Normal
compressibility and flow on color Doppler imaging.

Profunda Femoral Vein: No evidence of thrombus. Normal
compressibility and flow on color Doppler imaging.

Femoral Vein: No evidence of thrombus. Normal compressibility,
respiratory phasicity and response to augmentation.

Popliteal Vein: No evidence of thrombus. Normal compressibility,
respiratory phasicity and response to augmentation.

Calf Veins: No evidence of thrombus. Normal compressibility and flow
on color Doppler imaging.

Superficial Great Saphenous Vein: No evidence of thrombus. Normal
compressibility.

Venous Reflux:  None.

Other Findings:  None.
IMPRESSION: No evidence of acute or chronic DVT within either lower extremity.

## 2020-06-23 ENCOUNTER — Encounter: Payer: Self-pay | Admitting: Internal Medicine

## 2020-06-27 ENCOUNTER — Other Ambulatory Visit: Payer: Self-pay | Admitting: Internal Medicine

## 2020-06-27 DIAGNOSIS — E669 Obesity, unspecified: Secondary | ICD-10-CM

## 2020-06-27 MED ORDER — PHENTERMINE HCL 37.5 MG PO TABS: 18.2500 mg | ORAL_TABLET | Freq: Every day | ORAL | 0 refills | Status: DC

## 2020-08-01 DIAGNOSIS — Z23 Encounter for immunization: Secondary | ICD-10-CM | POA: Diagnosis not present

## 2020-09-15 ENCOUNTER — Other Ambulatory Visit: Payer: Self-pay | Admitting: *Deleted

## 2020-09-15 DIAGNOSIS — K219 Gastro-esophageal reflux disease without esophagitis: Secondary | ICD-10-CM

## 2020-09-15 MED ORDER — OMEPRAZOLE 40 MG PO CPDR: 40.0000 mg | DELAYED_RELEASE_CAPSULE | Freq: Every day | ORAL | 3 refills | Status: DC

## 2020-09-22 DIAGNOSIS — J069 Acute upper respiratory infection, unspecified: Secondary | ICD-10-CM | POA: Diagnosis not present

## 2020-10-21 ENCOUNTER — Other Ambulatory Visit: Payer: Self-pay

## 2020-10-21 ENCOUNTER — Ambulatory Visit (INDEPENDENT_AMBULATORY_CARE_PROVIDER_SITE_OTHER): Payer: BC Managed Care – PPO | Admitting: Internal Medicine

## 2020-10-21 ENCOUNTER — Encounter: Payer: Self-pay | Admitting: Internal Medicine

## 2020-10-21 VITALS — BP 124/86 | HR 85 | Temp 98.1°F | Ht 66.93 in | Wt 207.8 lb

## 2020-10-21 DIAGNOSIS — Z13220 Encounter for screening for lipoid disorders: Secondary | ICD-10-CM

## 2020-10-21 DIAGNOSIS — Z0000 Encounter for general adult medical examination without abnormal findings: Secondary | ICD-10-CM

## 2020-10-21 DIAGNOSIS — E669 Obesity, unspecified: Secondary | ICD-10-CM

## 2020-10-21 DIAGNOSIS — Z13818 Encounter for screening for other digestive system disorders: Secondary | ICD-10-CM

## 2020-10-21 DIAGNOSIS — E559 Vitamin D deficiency, unspecified: Secondary | ICD-10-CM

## 2020-10-21 DIAGNOSIS — Z1389 Encounter for screening for other disorder: Secondary | ICD-10-CM

## 2020-10-21 DIAGNOSIS — Z1329 Encounter for screening for other suspected endocrine disorder: Secondary | ICD-10-CM

## 2020-10-21 MED ORDER — PHENTERMINE HCL 37.5 MG PO TABS: 18.2500 mg | ORAL_TABLET | Freq: Every day | ORAL | 0 refills | Status: DC

## 2020-10-21 NOTE — Progress Notes (Signed)
Chief Complaint  Patient presents with  . Annual Exam    Sees OBGYN   Annual doing well  1. Labs with work will order additional labs if needed    Review of Systems  Constitutional: Negative for weight loss.  HENT: Negative for hearing loss.   Eyes: Negative for blurred vision.  Respiratory: Negative for shortness of breath.   Cardiovascular: Negative for chest pain.  Gastrointestinal: Negative for abdominal pain.  Genitourinary: Negative for dysuria.  Musculoskeletal: Negative for falls and joint pain.  Skin: Negative for rash.  Neurological: Negative for headaches.  Psychiatric/Behavioral: Negative for depression.   Past Medical History:  Diagnosis Date  . Abnormal Pap smear of cervix   . DVT (deep venous thrombosis) (HCC) 2014   right leg was on OCP  . Migraine    in college   . STD (female)    HSV, HPV   Past Surgical History:  Procedure Laterality Date  . CERVICAL BIOPSY  W/ LOOP ELECTRODE EXCISION  2009   Family History  Problem Relation Age of Onset  . Miscarriages / [place] Mother   . Edema Mother   . Hypertension Father   . Asthma Brother   . Edema Brother        lymphedema  . Peripheral Artery Disease Maternal Grandmother        with stent  . Alcohol abuse Maternal Grandfather   . Cancer Maternal Grandfather        lung cancer mets smoker  . Alcohol abuse Paternal Grandfather   . Cancer Paternal Grandfather        pancreatitc   . Hypertension Paternal Grandfather    Social History   Socioeconomic History  . Marital status: Single    Spouse name: Not on file  . Number of children: Not on file  . Years of education: Not on file  . Highest education level: Not on file  Occupational History  . Not on file  Tobacco Use  . Smoking status: Never Smoker  . Smokeless tobacco: Never Used  Vaping Use  . Vaping Use: Never used  Substance and Sexual Activity  . Alcohol use: No    Alcohol/week: 0.0 standard drinks  . Drug use: No  . Sexual  activity: Yes  Other Topics Concern  . Not on file  Social History Narrative   College [employer]    Single    1 daughter    Customer service dept      Never smoker    Owns guns, wears seat belt    Social Determinants of Health   Financial Resource Strain:   . Difficulty of Paying Living Expenses: Not on file  Food Insecurity:   . Worried About Programme researcher, broadcasting/film/video in the Last Year: Not on file  . Ran Out of Food in the Last Year: Not on file  Transportation Needs:   . Lack of Transportation (Medical): Not on file  . Lack of Transportation (Non-Medical): Not on file  Physical Activity:   . Days of Exercise per Week: Not on file  . Minutes of Exercise per Session: Not on file  Stress:   . Feeling of Stress : Not on file  Social Connections:   . Frequency of Communication with Friends and Family: Not on file  . Frequency of Social Gatherings with Friends and Family: Not on file  . Attends Religious Services: Not on file  . Active Member of Clubs or Organizations: Not on file  . Attends Club  or Organization Meetings: Not on file  . Marital Status: Not on file  Intimate Partner Violence:   . Fear of Current or Ex-Partner: Not on file  . Emotionally Abused: Not on file  . Physically Abused: Not on file  . Sexually Abused: Not on file   Current Meds  Medication Sig  . Cetirizine HCl 10 MG CAPS Take 10 mg by mouth as needed. Reported on 11/03/2015  . fluticasone (FLONASE) 50 MCG/ACT nasal spray Place into both nostrils daily.  Marland Kitchen omeprazole (PRILOSEC) 40 MG capsule Take 1 capsule (40 mg total) by mouth daily. 30 minutes  . phentermine (ADIPEX-P) 37.5 MG tablet Take 0.5-1 tablets (18.75-37.5 mg total) by mouth daily before breakfast.  . VITAMIN D PO Take by mouth.  . [DISCONTINUED] phentermine (ADIPEX-P) 37.5 MG tablet Take 0.5-1 tablets (18.75-37.5 mg total) by mouth daily before breakfast. rx 1/2   Allergies  Allergen Reactions  . Amoxicillin Rash   No results found for this  or any previous visit (from the past 2160 hour(s)). Objective  Body mass index is 32.62 kg/m. Wt Readings from Last 3 Encounters:  10/21/20 207 lb 12.8 oz (94.3 kg)  03/06/20 205 lb (93 kg)  11/05/19 193 lb (87.5 kg)   Temp Readings from Last 3 Encounters:  10/21/20 98.1 F (36.7 C) (Oral)  11/05/19 (!) 96.2 F (35.7 C)  09/06/19 97.9 F (36.6 C) (Oral)   BP Readings from Last 3 Encounters:  10/21/20 124/86  11/05/19 100/70  09/06/19 112/76   Pulse Readings from Last 3 Encounters:  10/21/20 85  09/06/19 (!) 58  12/29/18 74    Physical Exam Vitals and nursing note reviewed.  Constitutional:      Appearance: Normal appearance. She is well-developed and well-groomed. She is obese.  HENT:     Head: Normocephalic and atraumatic.  Eyes:     Conjunctiva/sclera: Conjunctivae normal.     Pupils: Pupils are equal, round, and reactive to light.  Cardiovascular:     Rate and Rhythm: Normal rate and regular rhythm.     Heart sounds: Normal heart sounds. No murmur heard.   Pulmonary:     Effort: Pulmonary effort is normal.     Breath sounds: Normal breath sounds.  Skin:    General: Skin is warm and dry.  Neurological:     General: No focal deficit present.     Mental Status: She is alert and oriented to person, place, and time. Mental status is at baseline.     Gait: Gait normal.  Psychiatric:        Attention and Perception: Attention and perception normal.        Mood and Affect: Mood and affect normal.        Speech: Speech normal.        Behavior: Behavior normal. Behavior is cooperative.        Thought Content: Thought content normal.        Cognition and Memory: Cognition and memory normal.        Judgment: Judgment normal.     Assessment  Plan  Annual physical exam - Plan: Comprehensive metabolic panel, Lipid panel, CBC with Differential/Platelet, TSH, T4, free, Urinalysis, Routine w reflex microscopic, Vitamin D (25 hydroxy), Hepatitis C antibody Flu  shotutdhad 08/2020 Tdap maybe hadin 2016 covid vx 2/2 pfizer, moderna 1/1  Declines STD check today h/o HPV and HSV since 20s  Pap OB/GYN Dr. Tiburcio Pea sch 10/2020 10/30/20 s/p LEEPpap 10/30/18 neg neg HPVsch for f/u 11/05/19  ob/gyn Screening mammogram rec 34 y.o  colonscopy age 81 y.o  rec D28 to5000 IU daily rec healthy diet and exercise   Obesity (BMI 30-39.9) - Plan: phentermine (ADIPEX-P) 37.5 MG tablet taking 1/2 pill prn     Provider: Dr. French Ana McLean-Scocuzza-Internal Medicine

## 2020-10-21 NOTE — Patient Instructions (Signed)
Hematoma A hematoma is a collection of blood under the skin, in an organ, in a body space, in a joint space, or in other tissue. The blood can thicken (clot) to form a lump that you can see and feel. The lump is often firm and may become sore and tender. Most hematomas get better in a few days to weeks. However, some hematomas may be serious and require medical care. Hematomas can range from very small to very large. What are the causes? This condition is caused by:  A blunt or penetrating injury.  A leakage from a blood vessel under the skin.  Some medical procedures, including surgeries, such as oral surgery, face lifts, and surgeries on the joints.  Some medical conditions that cause bleeding or bruising. There may be multiple hematomas that appear in different areas of the body. What increases the risk? You are more likely to develop this condition if:  You are an older adult.  You use blood thinners. What are the signs or symptoms?  Symptoms of this condition depend on where the hematoma is located.  Common symptoms of a hematoma that is under the skin include:  A firm lump on the body.  Pain and tenderness in the area.  Bruising. Blue, dark blue, purple-red, or yellowish skin (discoloration) may appear at the site of the hematoma if the hematoma is close to the surface of the skin. Common symptoms of a hematoma that is deep in the tissues or body spaces may be less obvious. They include:  A collection of blood in the stomach (intra-abdominal hematoma). This may cause pain in the abdomen, weakness, fainting, and shortness of breath.  A collection of blood in the head (intracranial hematoma). This may cause a headache or symptoms such as weakness, trouble speaking or understanding, or a change in consciousness. How is this diagnosed? This condition is diagnosed based on:  Your medical history.  A physical exam.  Imaging tests, such as an ultrasound or CT scan. These may  be needed if your health care provider suspects a hematoma in deeper tissues or body spaces.  Blood tests. These may be needed if your health care provider believes that the hematoma is caused by a medical condition. How is this treated? Treatment for this condition depends on the cause, size, and location of the hematoma. Treatment may include:  Doing nothing. The majority of hematomas do not need treatment as many of them go away on their own over time.  Surgery or close monitoring. This may be needed for large hematomas or hematomas that affect vital organs.  Medicines. Medicines may be given if there is an underlying medical cause for the hematoma. Follow these instructions at home: Managing pain, stiffness, and swelling   If directed, put ice on the affected area. ? Put ice in a plastic bag. ? Place a towel between your skin and the bag. ? Leave the ice on for 20 minutes, 2-3 times a day for the first couple of days.  If directed, apply heat to the affected area after applying ice for a couple of days. Use the heat source that your health care provider recommends, such as a moist heat pack or a heating pad. ? Place a towel between your skin and the heat source. ? Leave the heat on for 20-30 minutes. ? Remove the heat if your skin turns bright red. This is especially important if you are unable to feel pain, heat, or cold. You may have a greater   risk of getting burned.  Raise (elevate) the affected area above the level of your heart while you are sitting or lying down.  If told, wrap the affected area with an elastic bandage. The bandage applies pressure (compression) to the area, which may help to reduce swelling and promote healing. Do not wrap the bandage too tightly around the affected area.  If your hematoma is on a leg or foot (lower extremity) and is painful, your health care provider may recommend crutches. Use them as told by your health care provider. General  instructions  Take over-the-counter and prescription medicines only as told by your health care provider.  Keep all follow-up visits as told by your health care provider. This is important. Contact a health care provider if:  You have a fever.  The swelling or discoloration gets worse.  You develop more hematomas. Get help right away if:  Your pain is worse or your pain is not controlled with medicine.  Your skin over the hematoma breaks or starts bleeding.  Your hematoma is in your chest or abdomen and you have weakness, shortness of breath, or a change in consciousness.  You have a hematoma on your scalp that is caused by a fall or injury, and you also have: ? A headache that gets worse. ? Trouble speaking or understanding speech. ? Weakness. ? Change in alertness or consciousness. Summary  A hematoma is a collection of blood under the skin, in an organ, in a body space, in a joint space, or in other tissue.  This condition usually does not need treatment because many hematomas go away on their own over time.  Large hematomas, or those that may affect vital organs, may need surgical drainage or monitoring. If the hematoma is caused by a medical condition, medicines may be prescribed.  Get help right away if your hematoma breaks or starts to bleed, you have shortness of breath, or you have a headache or trouble speaking after a fall. This information is not intended to replace advice given to you by your health care provider. Make sure you discuss any questions you have with your health care provider. Document Revised: 03/28/2019 Document Reviewed: 04/06/2018 Elsevier Patient Education  2020 [employer]  

## 2020-10-28 DIAGNOSIS — Z0189 Encounter for other specified special examinations: Secondary | ICD-10-CM | POA: Diagnosis not present

## 2020-10-29 DIAGNOSIS — Z043 Encounter for examination and observation following other accident: Secondary | ICD-10-CM | POA: Diagnosis not present

## 2020-10-29 DIAGNOSIS — Z713 Dietary counseling and surveillance: Secondary | ICD-10-CM | POA: Diagnosis not present

## 2020-10-29 DIAGNOSIS — R7301 Impaired fasting glucose: Secondary | ICD-10-CM | POA: Diagnosis not present

## 2020-11-05 ENCOUNTER — Other Ambulatory Visit: Payer: Self-pay

## 2020-11-05 ENCOUNTER — Ambulatory Visit (INDEPENDENT_AMBULATORY_CARE_PROVIDER_SITE_OTHER): Payer: BC Managed Care – PPO | Admitting: Obstetrics & Gynecology

## 2020-11-05 ENCOUNTER — Encounter: Payer: Self-pay | Admitting: Obstetrics & Gynecology

## 2020-11-05 VITALS — BP 100/60 | Ht 66.0 in | Wt 206.0 lb

## 2020-11-05 DIAGNOSIS — Z01419 Encounter for gynecological examination (general) (routine) without abnormal findings: Secondary | ICD-10-CM | POA: Diagnosis not present

## 2020-11-05 NOTE — Patient Instructions (Signed)
Thank you for choosing [facility]. As part of our ongoing efforts to improve patient experience, we would appreciate your feedback. Please fill out the short survey that you will receive by mail or MyChart. Your opinion is important to us! -Dr Sanayah Munro  Recommendations to boost your immunity to prevent illness such as viral flu and colds, including covid19, are as follows:       - - -  Vitamin K2 and Vitamin D3  - - - Take Vitamin K2 at 200-300 mcg daily (usually 2-3 pills daily of the over the counter formulation). Take Vitamin D3 at 3000-4000 U daily (usually 3-4 pills daily of the over the counter formulation). Studies show that these two at high normal levels in your system are very effective in keeping your immunity so strong and protective that you will be unlikely to contract viral illness such as those listed above.  Dr Brigid Vandekamp  

## 2020-11-05 NOTE — Progress Notes (Signed)
HPI:      Emily Griffin is a 34 y.o. G2P1001 who LMP was Patient's last menstrual period was 10/13/2020., she presents today for her annual examination. The patient has no complaints today. The patient is sexually active. Her last pap: approximate date 2019 and was normal. The patient does perform self breast exams.  There is no notable family history of breast or ovarian cancer in her family.  The patient has regular exercise: yes.  The patient denies current symptoms of depression.    GYN History: Contraception: abstinence  PMHx: Past Medical History:  Diagnosis Date  . Abnormal Pap smear of cervix   . DVT (deep venous thrombosis) (HCC) 2014   right leg was on OCP  . Migraine    in college   . STD (female)    HSV, HPV   Past Surgical History:  Procedure Laterality Date  . CERVICAL BIOPSY  W/ LOOP ELECTRODE EXCISION  2009   Family History  Problem Relation Age of Onset  . Miscarriages / [place] Mother   . Edema Mother   . Hypertension Father   . Asthma Brother   . Edema Brother        lymphedema  . Peripheral Artery Disease Maternal Grandmother        with stent  . Alcohol abuse Maternal Grandfather   . Cancer Maternal Grandfather        lung cancer mets smoker  . Alcohol abuse Paternal Grandfather   . Cancer Paternal Grandfather        pancreatitc   . Hypertension Paternal Grandfather    Social History   Tobacco Use  . Smoking status: Never Smoker  . Smokeless tobacco: Never Used  Vaping Use  . Vaping Use: Never used  Substance Use Topics  . Alcohol use: No    Alcohol/week: 0.0 standard drinks  . Drug use: No    Current Outpatient Medications:  .  Cetirizine HCl 10 MG CAPS, Take 10 mg by mouth as needed. Reported on 11/03/2015, Disp: , Rfl:  .  fluticasone (FLONASE) 50 MCG/ACT nasal spray, Place into both nostrils daily., Disp: , Rfl:  .  omeprazole (PRILOSEC) 40 MG capsule, Take 1 capsule (40 mg total) by mouth daily. 30 minutes, Disp: 90 capsule,  Rfl: 3 .  VITAMIN D PO, Take by mouth., Disp: , Rfl:  Allergies: Amoxicillin  Review of Systems  Constitutional: Positive for malaise/fatigue. Negative for chills and fever.  HENT: Negative for congestion, sinus pain and sore throat.   Eyes: Negative for blurred vision and pain.  Respiratory: Negative for cough and wheezing.   Cardiovascular: Negative for chest pain and leg swelling.  Gastrointestinal: Negative for abdominal pain, constipation, diarrhea, heartburn, nausea and vomiting.  Genitourinary: Negative for dysuria, frequency, hematuria and urgency.  Musculoskeletal: Negative for back pain, joint pain, myalgias and neck pain.  Skin: Negative for itching and rash.  Neurological: Negative for dizziness, tremors and weakness.  Endo/Heme/Allergies: Does not bruise/bleed easily.  Psychiatric/Behavioral: Negative for depression. The patient is not nervous/anxious and does not have insomnia.     Objective: BP 100/60   Ht 5\' 6"  (1.676 m)   Wt 206 lb (93.4 kg)   LMP 10/13/2020   BMI 33.25 kg/m   Filed Weights   11/05/20 0955  Weight: 206 lb (93.4 kg)   Body mass index is 33.25 kg/m. Physical Exam Constitutional:      General: She is not in acute distress.    Appearance: She is well-developed  and well-nourished.  Genitourinary:     Vagina, uterus and rectum normal.     There is no rash or lesion on the right labia.     There is no rash or lesion on the left labia.    No lesions in the vagina.     No vaginal bleeding.      Right Adnexa: not tender and no mass present.    Left Adnexa: not tender and no mass present.    No cervical motion tenderness, friability, lesion or polyp.     Uterus is mobile.     Uterus is not enlarged.     No uterine mass detected.    Uterus is midaxial.     Pelvic exam was performed with patient supine.  Breasts:     Right: No mass, skin change or tenderness.     Left: No mass, skin change or tenderness.    HENT:     Head: Normocephalic and  atraumatic. No laceration.     Right Ear: Hearing normal.     Left Ear: Hearing normal.     Nose: No epistaxis or foreign body.     Mouth/Throat:     Mouth: Oropharynx is clear and moist and mucous membranes are normal.     Pharynx: Uvula midline.  Eyes:     Pupils: Pupils are equal, round, and reactive to light.  Neck:     Thyroid: No thyromegaly.  Cardiovascular:     Rate and Rhythm: Normal rate and regular rhythm.     Heart sounds: No murmur heard. No friction rub. No gallop.   Pulmonary:     Effort: Pulmonary effort is normal. No respiratory distress.     Breath sounds: Normal breath sounds. No wheezing.  Abdominal:     General: Bowel sounds are normal. There is no distension.     Palpations: Abdomen is soft.     Tenderness: There is no abdominal tenderness. There is no rebound.  Musculoskeletal:        General: Normal range of motion.     Cervical back: Normal range of motion and neck supple.  Neurological:     Mental Status: She is alert and oriented to person, place, and time.     Cranial Nerves: No cranial nerve deficit.  Skin:    General: Skin is warm and dry.  Psychiatric:        Mood and Affect: Mood and affect normal.        Judgment: Judgment normal.  Vitals reviewed.     Assessment:  ANNUAL EXAM 1. Women's annual routine gynecological examination    Plans weight loss thru diet changes, exercise, and Phentermine as Rx by PCP.  Will monitor her goals with her.  Encouraged and reassured.  Screening Plan:            1.  Cervical Screening-  Pap smear schedule reviewed with patient  2. Breast screening- Exam annually and mammogram>40 planned   3. Labs managed by PCP  4. Counseling for contraception: abstinence    F/U  Return in about 1 year (around 11/05/2021) for Annual.  Annamarie Major, MD, [facility], [facility] 11/05/2020  10:40 AM

## 2021-02-15 ENCOUNTER — Encounter: Payer: Self-pay | Admitting: Internal Medicine

## 2021-02-16 ENCOUNTER — Emergency Department
Admission: EM | Admit: 2021-02-16 | Discharge: 2021-02-16 | Disposition: A | Discharge status: Home / Self Care | Payer: BC Managed Care – PPO | Attending: Emergency Medicine | Admitting: Emergency Medicine

## 2021-02-16 ENCOUNTER — Emergency Department: Payer: BC Managed Care – PPO

## 2021-02-16 ENCOUNTER — Other Ambulatory Visit: Payer: Self-pay

## 2021-02-16 DIAGNOSIS — M79604 Pain in right leg: Secondary | ICD-10-CM | POA: Insufficient documentation

## 2021-02-16 DIAGNOSIS — R600 Localized edema: Secondary | ICD-10-CM | POA: Diagnosis not present

## 2021-02-16 DIAGNOSIS — M79661 Pain in right lower leg: Secondary | ICD-10-CM | POA: Diagnosis not present

## 2021-02-16 NOTE — ED Notes (Signed)
D.c VS declined

## 2021-02-16 NOTE — ED Triage Notes (Signed)
Pt c/o right calf pain with mild swelling since yesterday, states she has a hx of DVT In the same leg in 2014.Marland Kitchen

## 2021-02-16 NOTE — ED Provider Notes (Signed)
[facility] Emergency Department Provider Note  ____________________________________________   Event Date/Time   First MD Initiated Contact with Patient 02/16/21 1458     (approximate)  I have reviewed the triage vital signs and the nursing notes.   HISTORY  Chief Complaint Leg Pain   HPI Nou Chard is a 35 y.o. female with past medical history of remote DVT, migraine headaches and reflux who presents for assessment of some right posterior lower leg pain she noticed yesterday.  She states she is worried about possible blood clot as this feels similar to how she felt when she had DVT in 2014.  She is not on antianticoagulation.  She has not had any other acute sick symptoms but does note she does have some burning her chest every now and then from her reflux.  None today or significant shortness of breath, cough, fevers, vomiting, diarrhea, dysuria, rash, abdominal pain, back pain or any discomfort in any of the other extremities.  No recent falls injuries.  No other acute concerns at this time.         Past Medical History:  Diagnosis Date  . Abnormal Pap smear of cervix   . DVT (deep venous thrombosis) (HCC) 2014   right leg was on OCP  . Migraine    in college   . STD (female)    HSV, HPV    Patient Active Problem List   Diagnosis Date Noted  . Bilateral carpal tunnel syndrome 06/01/2020  . Obesity (BMI 30-39.9) 03/07/2020  . Annual physical exam 09/07/2019  . Right knee pain 12/29/2018  . Bilateral leg edema 09/28/2018  . Vitamin D deficiency 09/28/2018  . History of deep vein thrombophlebitis of lower extremity 11/05/2015    Past Surgical History:  Procedure Laterality Date  . CERVICAL BIOPSY  W/ LOOP ELECTRODE EXCISION  2009    Prior to Admission medications   Medication Sig Start Date End Date Taking? Authorizing Provider  Cetirizine HCl 10 MG CAPS Take 10 mg by mouth as needed. Reported on 11/03/2015    [provider]   fluticasone (FLONASE) 50 MCG/ACT nasal spray Place into both nostrils daily.    [provider]  omeprazole (PRILOSEC) 40 MG capsule Take 1 capsule (40 mg total) by mouth daily. 30 minutes 09/15/20   McLean-Scocuzza, Pasty Spillers, MD  VITAMIN D PO Take by mouth.    [provider]    Allergies Amoxicillin  Family History  Problem Relation Age of Onset  . Miscarriages / [place] Mother   . Edema Mother   . Hypertension Father   . Asthma Brother   . Edema Brother        lymphedema  . Peripheral Artery Disease Maternal Grandmother        with stent  . Alcohol abuse Maternal Grandfather   . Cancer Maternal Grandfather        lung cancer mets smoker  . Alcohol abuse Paternal Grandfather   . Cancer Paternal Grandfather        pancreatitc   . Hypertension Paternal Grandfather     Social History Social History   Tobacco Use  . Smoking status: Never Smoker  . Smokeless tobacco: Never Used  Vaping Use  . Vaping Use: Never used  Substance Use Topics  . Alcohol use: No    Alcohol/week: 0.0 standard drinks  . Drug use: No    Review of Systems  Review of Systems  Constitutional: Negative for chills and fever.  HENT:  Negative for sore throat.   Eyes: Negative for pain.  Respiratory: Negative for cough and stridor.   Cardiovascular: Negative for chest pain.  Gastrointestinal: Negative for vomiting.  Genitourinary: Negative for dysuria.  Musculoskeletal: Positive for myalgias ( R posterior calf).  Skin: Negative for rash.  Neurological: Negative for seizures, loss of consciousness and headaches.  Psychiatric/Behavioral: Negative for suicidal ideas.  All other systems reviewed and are negative.     ____________________________________________   PHYSICAL EXAM:  VITAL SIGNS: ED Triage Vitals  Enc Vitals Group     BP 02/16/21 1422 (!) 133/92     Pulse Rate 02/16/21 1422 79     Resp 02/16/21 1422 17     Temp 02/16/21 1422 98.5 F (36.9 C)     Temp  Source 02/16/21 1422 Oral     SpO2 02/16/21 1422 100 %     Weight 02/16/21 1423 195 lb (88.5 kg)     Height 02/16/21 1423 5\' 6"  (1.676 m)     Head Circumference --      Peak Flow --      Pain Score 02/16/21 1423 8     Pain Loc --      Pain Edu? --      Excl. in GC? --    Vitals:   02/16/21 1422  BP: (!) 133/92  Pulse: 79  Resp: 17  Temp: 98.5 F (36.9 C)  SpO2: 100%   Physical Exam Vitals and nursing note reviewed.  Constitutional:      General: She is not in acute distress.    Appearance: She is well-developed. She is obese.  HENT:     Head: Normocephalic and atraumatic.     Right Ear: External ear normal.     Left Ear: External ear normal.     Nose: Nose normal.  Eyes:     Conjunctiva/sclera: Conjunctivae normal.  Cardiovascular:     Rate and Rhythm: Normal rate and regular rhythm.     Heart sounds: No murmur heard.   Pulmonary:     Effort: Pulmonary effort is normal. No respiratory distress.     Breath sounds: Normal breath sounds.  Abdominal:     Palpations: Abdomen is soft.     Tenderness: There is no abdominal tenderness.  Musculoskeletal:     Cervical back: Neck supple.  Skin:    General: Skin is warm and dry.     Capillary Refill: Capillary refill takes less than 2 seconds.  Neurological:     Mental Status: She is alert and oriented to person, place, and time.  Psychiatric:        Mood and Affect: Mood normal.     2+ DP pulse on the right.  Patient has full strength and range of motion at the right knee and right ankle.  No palpable cords or overlying skin changes about the calf ankle and knee including erythema, induration, focal areas of tenderness or other overlying skin changes. ____________________________________________   LABS (all labs ordered are listed, but only abnormal results are displayed)  Labs Reviewed - No data to  display ____________________________________________  EKG  ____________________________________________  RADIOLOGY  ED MD interpretation: Right lower extremity ultrasound shows no evidence of DVT or ruptured Baker's cyst or other acute abnormality.   Official radiology report(s): 04/18/21 Venous Img Lower Unilateral Right  Result Date: 02/16/2021 CLINICAL DATA:  Calf pain and edema EXAM: RIGHT LOWER EXTREMITY VENOUS DUPLEX ULTRASOUND TECHNIQUE: Gray-scale sonography with graded compression, as well as color Doppler and  duplex ultrasound were performed to evaluate the right lower extremity deep venous system from the level of the common femoral vein and including the common femoral, femoral, profunda femoral, popliteal and calf veins including the posterior tibial, peroneal and gastrocnemius veins when visible. The superficial great saphenous vein was also interrogated. Spectral Doppler was utilized to evaluate flow at rest and with distal augmentation maneuvers in the common femoral, femoral and popliteal veins. COMPARISON:  None. FINDINGS: Contralateral Common Femoral Vein: Respiratory phasicity is normal and symmetric with the symptomatic side. No evidence of thrombus. Normal compressibility. Common Femoral Vein: No evidence of thrombus. Normal compressibility, respiratory phasicity and response to augmentation. Saphenofemoral Junction: No evidence of thrombus. Normal compressibility and flow on color Doppler imaging. Profunda Femoral Vein: No evidence of thrombus. Normal compressibility and flow on color Doppler imaging. Femoral Vein: No evidence of thrombus. Normal compressibility, respiratory phasicity and response to augmentation. Popliteal Vein: No evidence of thrombus. Normal compressibility, respiratory phasicity and response to augmentation. Calf Veins: No evidence of thrombus. Normal compressibility and flow on color Doppler imaging. Superficial Great Saphenous Vein: No evidence of thrombus. Normal  compressibility. Venous Reflux:  None. Other Findings:  None. IMPRESSION: No evidence of deep venous thrombosis in the right lower extremity. Left common femoral vein also patent. Electronically Signed   By: Bretta Bang III M.D.   On: 02/16/2021 15:27    ____________________________________________   PROCEDURES  Procedure(s) performed (including Critical Care):  Procedures   ____________________________________________   INITIAL IMPRESSION / ASSESSMENT AND PLAN / ED COURSE      Patient presents for some soreness in her right calf with concern for possible DVT as she felt very similar to this when she had DVT several years ago.  On arrival she is afebrile hemodynamically stable.  There is no history or exam findings suggest acute traumatic injury, gout, septic arthritis, cellulitis or neurovascular deficit.  Ultrasound shows no evidence of DVT or ruptured Baker's cyst.  Impression is myalgia of unclear etiology.  Patient offered some p.o. analgesia but declined this.  Given stable vitals otherwise reassuring exam and ultrasound I believe she is safe for discharge with plan for outpatient follow-up.  Discharged stable condition.  Strict return precautions advised and discussed.       ____________________________________________   FINAL CLINICAL IMPRESSION(S) / ED DIAGNOSES  Final diagnoses:  Right leg pain    Medications - No data to display   ED Discharge Orders    None       Note:  This document was prepared using Dragon voice recognition software and may include unintentional dictation errors.   Gilles Chiquito, MD 02/16/21 [phone]

## 2021-02-16 NOTE — Telephone Encounter (Signed)
Spoken to patient, due to patient sx of swelling and tender to the touch and a visible knot, and pt hx. I instructed pt to go to UC/ED. She stated she would comply with directions.

## 2021-02-19 ENCOUNTER — Ambulatory Visit: Payer: BC Managed Care – PPO | Admitting: Internal Medicine

## 2021-03-13 DIAGNOSIS — J00 Acute nasopharyngitis [common cold]: Secondary | ICD-10-CM | POA: Diagnosis not present

## 2021-09-04 ENCOUNTER — Encounter: Payer: Self-pay | Admitting: Internal Medicine

## 2021-09-04 ENCOUNTER — Other Ambulatory Visit: Payer: Self-pay

## 2021-09-04 ENCOUNTER — Ambulatory Visit: Payer: BC Managed Care – PPO | Admitting: Internal Medicine

## 2021-09-04 VITALS — BP 120/84 | HR 65 | Temp 98.1°F | Ht 66.0 in | Wt 203.8 lb

## 2021-09-04 DIAGNOSIS — Z13818 Encounter for screening for other digestive system disorders: Secondary | ICD-10-CM

## 2021-09-04 DIAGNOSIS — K219 Gastro-esophageal reflux disease without esophagitis: Secondary | ICD-10-CM

## 2021-09-04 DIAGNOSIS — Z0184 Encounter for antibody response examination: Secondary | ICD-10-CM

## 2021-09-04 DIAGNOSIS — M25561 Pain in right knee: Secondary | ICD-10-CM

## 2021-09-04 DIAGNOSIS — Z0000 Encounter for general adult medical examination without abnormal findings: Secondary | ICD-10-CM

## 2021-09-04 DIAGNOSIS — G8929 Other chronic pain: Secondary | ICD-10-CM

## 2021-09-04 DIAGNOSIS — Z1329 Encounter for screening for other suspected endocrine disorder: Secondary | ICD-10-CM

## 2021-09-04 DIAGNOSIS — E669 Obesity, unspecified: Secondary | ICD-10-CM

## 2021-09-04 DIAGNOSIS — Z1389 Encounter for screening for other disorder: Secondary | ICD-10-CM

## 2021-09-04 DIAGNOSIS — E559 Vitamin D deficiency, unspecified: Secondary | ICD-10-CM | POA: Diagnosis not present

## 2021-09-04 DIAGNOSIS — R109 Unspecified abdominal pain: Secondary | ICD-10-CM

## 2021-09-04 MED ORDER — OZEMPIC (0.25 OR 0.5 MG/DOSE) 2 MG/1.5ML ~~LOC~~ SOPN: 0.5000 mg | PEN_INJECTOR | SUBCUTANEOUS | 2 refills | Status: DC

## 2021-09-04 MED ORDER — OMEPRAZOLE 40 MG PO CPDR: 40.0000 mg | DELAYED_RELEASE_CAPSULE | Freq: Every day | ORAL | 3 refills | Status: DC

## 2021-09-04 NOTE — Patient Instructions (Addendum)
Pepcid, nexium  Consider covid vaccine   Tylenol  Aspercream with lidocaine Heat  Consider ultrasound abdomen in the pain worsening or continues    Semaglutide Injection What is this medication? SEMAGLUTIDE (SEM a GLOO tide) treats type 2 diabetes. It works by increasing insulin levels in your body, which decreases your blood sugar (glucose). It also reduces the amount of sugar released into the blood and slows down your digestion. It can also be used to lower the risk of heart attack and stroke in people with type 2 diabetes. Changes to diet and exercise are often combined with this medication. This medicine may be used for other purposes; ask your health care provider or pharmacist if you have questions. COMMON BRAND NAME(S): OZEMPIC What should I tell my care team before I take this medication? They need to know if you have any of these conditions: Endocrine tumors (MEN 2) or if someone in your family had these tumors Eye disease, vision problems History of pancreatitis Kidney disease Stomach problems Thyroid cancer or if someone in your family had thyroid cancer An unusual or allergic reaction to semaglutide, other medications, foods, dyes, or preservatives Pregnant or trying to get pregnant Breast-feeding How should I use this medication? This medication is for injection under the skin of your upper leg (thigh), stomach area, or upper arm. It is given once every week (every 7 days). You will be taught how to prepare and give this medication. Use exactly as directed. Take your medication at regular intervals. Do not take it more often than directed. If you use this medication with insulin, you should inject this medication and the insulin separately. Do not mix them together. Do not give the injections right next to each other. Change (rotate) injection sites with each injection. It is important that you put your used needles and syringes in a special sharps container. Do not put  them in a trash can. If you do not have a sharps container, call your pharmacist or care team to get one. A special MedGuide will be given to you by the pharmacist with each prescription and refill. Be sure to read this information carefully each time. This medication comes with INSTRUCTIONS FOR USE. Ask your pharmacist for directions on how to use this medication. Read the information carefully. Talk to your pharmacist or care team if you have questions. Talk to your care team about the use of this medication in children. Special care may be needed. Overdosage: If you think you have taken too much of this medicine contact a poison control center or emergency room at once. NOTE: This medicine is only for you. Do not share this medicine with others. What if I miss a dose? If you miss a dose, take it as soon as you can within 5 days after the missed dose. Then take your next dose at your regular weekly time. If it has been longer than 5 days after the missed dose, do not take the missed dose. Take the next dose at your regular time. Do not take double or extra doses. If you have questions about a missed dose, contact your care team for advice. What may interact with this medication? Other medications for diabetes Many medications may cause changes in blood sugar, these include: Alcohol containing beverages Antiviral medications for HIV or AIDS Aspirin and aspirin-like medications Certain medications for blood pressure, heart disease, irregular heart beat Chromium Diuretics Female hormones, such as estrogens or progestins, birth control pills Fenofibrate Gemfibrozil Isoniazid Lanreotide  Female hormones or anabolic steroids MAOIs like Carbex, Eldepryl, Marplan, Nardil, and Parnate Medications for weight loss Medications for allergies, asthma, cold, or cough Medications for depression, anxiety, or psychotic disturbances Niacin Nicotine NSAIDs, medications for pain and inflammation, like  ibuprofen or naproxen Octreotide Pasireotide Pentamidine Phenytoin Probenecid Quinolone antibiotics such as ciprofloxacin, levofloxacin, ofloxacin Some herbal dietary supplements Steroid medications such as prednisone or cortisone Sulfamethoxazole; trimethoprim Thyroid hormones Some medications can hide the warning symptoms of low blood sugar (hypoglycemia). You may need to monitor your blood sugar more closely if you are taking one of these medications. These include: Beta-blockers, often used for high blood pressure or heart problems (examples include atenolol, metoprolol, propranolol) Clonidine Guanethidine Reserpine This list may not describe all possible interactions. Give your health care provider a list of all the medicines, herbs, non-prescription drugs, or dietary supplements you use. Also tell them if you smoke, drink alcohol, or use illegal drugs. Some items may interact with your medicine. What should I watch for while using this medication? Visit your care team for regular checks on your progress. Drink plenty of fluids while taking this medication. Check with your care team if you get an attack of severe diarrhea, nausea, and vomiting. The loss of too much body fluid can make it dangerous for you to take this medication. A test called the HbA1C (A1C) will be monitored. This is a simple blood test. It measures your blood sugar control over the last 2 to 3 months. You will receive this test every 3 to 6 months. Learn how to check your blood sugar. Learn the symptoms of low and high blood sugar and how to manage them. Always carry a quick-source of sugar with you in case you have symptoms of low blood sugar. Examples include hard sugar candy or glucose tablets. Make sure others know that you can choke if you eat or drink when you develop serious symptoms of low blood sugar, such as seizures or unconsciousness. They must get medical help at once. Tell your care team if you have high  blood sugar. You might need to change the dose of your medication. If you are sick or exercising more than usual, you might need to change the dose of your medication. Do not skip meals. Ask your care team if you should avoid alcohol. Many nonprescription cough and cold products contain sugar or alcohol. These can affect blood sugar. Pens should never be shared. Even if the needle is changed, sharing may result in passing of viruses like hepatitis or HIV. Wear a medical ID bracelet or chain, and carry a card that describes your disease and details of your medication and dosage times. Do not become pregnant while taking this medication. Women should inform their care team if they wish to become pregnant or think they might be pregnant. There is a potential for serious side effects to an unborn child. Talk to your care team for more information. What side effects may I notice from receiving this medication? Side effects that you should report to your care team as soon as possible: Allergic reactions-skin rash, itching, hives, swelling of the face, lips, tongue, or throat Change in vision Dehydration-increased thirst, dry mouth, feeling faint or lightheaded, headache, dark yellow or brown urine Gallbladder problems-severe stomach pain, nausea, vomiting, fever Heart palpitations-rapid, pounding, or irregular heartbeat Kidney injury-decrease in the amount of urine, swelling of the ankles, hands, or feet Pancreatitis-severe stomach pain that spreads to your back or gets worse after eating or when  touched, fever, nausea, vomiting Thyroid cancer-new mass or lump in the neck, pain or trouble swallowing, trouble breathing, hoarseness Side effects that usually do not require medical attention (report to your care team if they continue or are bothersome): Diarrhea Loss of appetite Nausea Stomach pain Vomiting This list may not describe all possible side effects. Call your doctor for medical advice about  side effects. You may report side effects to FDA at 1-800-FDA-1088. Where should I keep my medication? Keep out of the reach of children. Store unopened pens in a refrigerator between 2 and 8 degrees C (36 and 46 degrees F). Do not freeze. Protect from light and heat. After you first use the pen, it can be stored for 56 days at room temperature between 15 and 30 degrees C (59 and 86 degrees F) or in a refrigerator. Throw away your used pen after 56 days or after the expiration date, whichever comes first. Do not store your pen with the needle attached. If the needle is left on, medication may leak from the pen. NOTE: This sheet is a summary. It may not cover all possible information. If you have questions about this medicine, talk to your doctor, pharmacist, or health care provider.  2022 Elsevier/Gold Standard (2020-12-29 16:01:53)  Gastroesophageal Reflux Disease, Adult Gastroesophageal reflux (GER) happens when acid from the stomach flows up into the tube that connects the mouth and the stomach (esophagus). Normally, food travels down the esophagus and stays in the stomach to be digested. However, when a person has GER, food and stomach acid sometimes move back up into the esophagus. If this becomes a more serious problem, the person may be diagnosed with a disease called gastroesophageal reflux disease (GERD). GERD occurs when the reflux: Happens often. Causes frequent or severe symptoms. Causes problems such as damage to the esophagus. When stomach acid comes in contact with the esophagus, the acid may cause inflammation in the esophagus. Over time, GERD may create small holes (ulcers) in the lining of the esophagus. What are the causes? This condition is caused by a problem with the muscle between the esophagus and the stomach (lower esophageal sphincter, or LES). Normally, the LES muscle closes after food passes through the esophagus to the stomach. When the LES is weakened or abnormal, it  does not close properly, and that allows food and stomach acid to go back up into the esophagus. The LES can be weakened by certain dietary substances, medicines, and medical conditions, including: Tobacco use. Pregnancy. Having a hiatal hernia. Alcohol use. Certain foods and beverages, such as coffee, chocolate, onions, and peppermint. What increases the risk? You are more likely to develop this condition if you: Have an increased body weight. Have a connective tissue disorder. Take NSAIDs, such as ibuprofen. What are the signs or symptoms? Symptoms of this condition include: Heartburn. Difficult or painful swallowing and the feeling of having a lump in the throat. A bitter taste in the mouth. Bad breath and having a large amount of saliva. Having an upset or bloated stomach and belching. Chest pain. Different conditions can cause chest pain. Make sure you see your health care provider if you experience chest pain. Shortness of breath or wheezing. Ongoing (chronic) cough or a nighttime cough. Wearing away of tooth enamel. Weight loss. How is this diagnosed? This condition may be diagnosed based on a medical history and a physical exam. To determine if you have mild or severe GERD, your health care provider may also monitor how you respond  to treatment. You may also have tests, including: A test to examine your stomach and esophagus with a small camera (endoscopy). A test that measures the acidity level in your esophagus. A test that measures how much pressure is on your esophagus. A barium swallow or modified barium swallow test to show the shape, size, and functioning of your esophagus. How is this treated? Treatment for this condition may vary depending on how severe your symptoms are. Your health care provider may recommend: Changes to your diet. Medicine. Surgery. The goal of treatment is to help relieve your symptoms and to prevent complications. Follow these instructions at  home: Eating and drinking  Follow a diet as recommended by your health care provider. This may involve avoiding foods and drinks such as: Coffee and tea, with or without caffeine. Drinks that contain alcohol. Energy drinks and sports drinks. Carbonated drinks or sodas. Chocolate and cocoa. Peppermint and mint flavorings. Garlic and onions. Horseradish. Spicy and acidic foods, including peppers, chili powder, curry powder, vinegar, hot sauces, and barbecue sauce. Citrus fruit juices and citrus fruits, such as oranges, lemons, and limes. Tomato-based foods, such as red sauce, chili, salsa, and pizza with red sauce. Fried and fatty foods, such as donuts, french fries, potato chips, and high-fat dressings. High-fat meats, such as hot dogs and fatty cuts of red and white meats, such as rib eye steak, sausage, ham, and bacon. High-fat dairy items, such as whole milk, butter, and cream cheese. Eat small, frequent meals instead of large meals. Avoid drinking large amounts of liquid with your meals. Avoid eating meals during the 2-3 hours before bedtime. Avoid lying down right after you eat. Do not exercise right after you eat. Lifestyle  Do not use any products that contain nicotine or tobacco. These products include cigarettes, chewing tobacco, and vaping devices, such as e-cigarettes. If you need help quitting, ask your health care provider. Try to reduce your stress by using methods such as yoga or meditation. If you need help reducing stress, ask your health care provider. If you are overweight, reduce your weight to an amount that is healthy for you. Ask your health care provider for guidance about a safe weight loss goal. General instructions Pay attention to any changes in your symptoms. Take over-the-counter and prescription medicines only as told by your health care provider. Do not take aspirin, ibuprofen, or other NSAIDs unless your health care provider told you to take these  medicines. Wear loose-fitting clothing. Do not wear anything tight around your waist that causes pressure on your abdomen. Raise (elevate) the head of your bed about 6 inches (15 cm). You can use a wedge to do this. Avoid bending over if this makes your symptoms worse. Keep all follow-up visits. This is important. Contact a health care provider if: You have: New symptoms. Unexplained weight loss. Difficulty swallowing or it hurts to swallow. Wheezing or a persistent cough. A hoarse voice. Your symptoms do not improve with treatment. Get help right away if: You have sudden pain in your arms, neck, jaw, teeth, or back. You suddenly feel sweaty, dizzy, or light-headed. You have chest pain or shortness of breath. You vomit and the vomit is green, yellow, or black, or it looks like blood or coffee grounds. You faint. You have stool that is red, bloody, or black. You cannot swallow, drink, or eat. These symptoms may represent a serious problem that is an emergency. Do not wait to see if the symptoms will go away. Get medical  help right away. Call your local emergency services (911 in the [place] Do not drive yourself to the hospital. Summary Gastroesophageal reflux happens when acid from the stomach flows up into the esophagus. GERD is a disease in which the reflux happens often, causes frequent or severe symptoms, or causes problems such as damage to the esophagus. Treatment for this condition may vary depending on how severe your symptoms are. Your health care provider may recommend diet and lifestyle changes, medicine, or surgery. Contact a health care provider if you have new or worsening symptoms. Take over-the-counter and prescription medicines only as told by your health care provider. Do not take aspirin, ibuprofen, or other NSAIDs unless your health care provider told you to do so. Keep all follow-up visits as told by your health care provider. This is important. This information is not  intended to replace advice given to you by your health care provider. Make sure you discuss any questions you have with your health care provider. Document Revised: 05/12/2020 Document Reviewed: 05/12/2020 Elsevier Patient Education  2022 [employer].  Food Choices for Gastroesophageal Reflux Disease, Adult When you have gastroesophageal reflux disease (GERD), the foods you eat and your eating habits are very important. Choosing the right foods can help ease the discomfort of GERD. Consider working with a dietitian to help you make healthy food choices. What are tips for following this plan? Reading food labels Look for foods that are low in saturated fat. Foods that have less than 5% of daily value (DV) of fat and 0 g of trans fats may help with your symptoms. Cooking Cook foods using methods other than frying. This may include baking, steaming, grilling, or broiling. These are all methods that do not need a lot of fat for cooking. To add flavor, try to use herbs that are low in spice and acidity. Meal planning  Choose healthy foods that are low in fat, such as fruits, vegetables, whole grains, low-fat dairy products, lean meats, fish, and poultry. Eat frequent, small meals instead of three large meals each day. Eat your meals slowly, in a relaxed setting. Avoid bending over or lying down until 2-3 hours after eating. Limit high-fat foods such as fatty meats or fried foods. Limit your intake of fatty foods, such as oils, butter, and shortening. Avoid the following as told by your health care provider: Foods that cause symptoms. These may be different for different people. Keep a food diary to keep track of foods that cause symptoms. Alcohol. Drinking large amounts of liquid with meals. Eating meals during the 2-3 hours before bed. Lifestyle Maintain a healthy weight. Ask your health care provider what weight is healthy for you. If you need to lose weight, work with your health care  provider to do so safely. Exercise for at least 30 minutes on 5 or more days each week, or as told by your health care provider. Avoid wearing clothes that fit tightly around your waist and chest. Do not use any products that contain nicotine or tobacco. These products include cigarettes, chewing tobacco, and vaping devices, such as e-cigarettes. If you need help quitting, ask your health care provider. Sleep with the head of your bed raised. Use a wedge under the mattress or blocks under the bed frame to raise the head of the bed. Chew sugar-free gum after mealtimes. What foods should I eat? Eat a healthy, well-balanced diet of fruits, vegetables, whole grains, low-fat dairy products, lean meats, fish, and poultry. Each person is different.  Foods that may trigger symptoms in one person may not trigger any symptoms in another person. Work with your health care provider to identify foods that are safe for you. The items listed above may not be a complete list of recommended foods and beverages. Contact a dietitian for more information. What foods should I avoid? Limiting some of these foods may help manage the symptoms of GERD. Everyone is different. Consult a dietitian or your health care provider to help you identify the exact foods to avoid, if any. Fruits Any fruits prepared with added fat. Any fruits that cause symptoms. For some people this may include citrus fruits, such as oranges, grapefruit, pineapple, and lemons. Vegetables Deep-fried vegetables. [place] fries. Any vegetables prepared with added fat. Any vegetables that cause symptoms. For some people, this may include tomatoes and tomato products, chili peppers, onions and garlic, and horseradish. Grains Pastries or quick breads with added fat. Meats and other proteins High-fat meats, such as fatty beef or pork, hot dogs, ribs, ham, sausage, salami, and bacon. Fried meat or protein, including fried fish and fried chicken. Nuts and nut  butters, in large amounts. Dairy Whole milk and chocolate milk. Sour cream. Cream. Ice cream. Cream cheese. Milkshakes. Fats and oils Butter. Margarine. Shortening. Ghee. Beverages Coffee and tea, with or without caffeine. Carbonated beverages. Sodas. Energy drinks. Fruit juice made with acidic fruits, such as orange or grapefruit. Tomato juice. Alcoholic drinks. Sweets and desserts Chocolate and cocoa. Donuts. Seasonings and condiments Pepper. Peppermint and spearmint. Added salt. Any condiments, herbs, or seasonings that cause symptoms. For some people, this may include curry, hot sauce, or vinegar-based salad dressings. The items listed above may not be a complete list of foods and beverages to avoid. Contact a dietitian for more information. Questions to ask your health care provider Diet and lifestyle changes are usually the first steps that are taken to manage symptoms of GERD. If diet and lifestyle changes do not improve your symptoms, talk with your health care provider about taking medicines. Where to find more information [employer]: aboutgerd.org Summary When you have gastroesophageal reflux disease (GERD), food and lifestyle choices may be very helpful in easing the discomfort of GERD. Eat frequent, small meals instead of three large meals each day. Eat your meals slowly, in a relaxed setting. Avoid bending over or lying down until 2-3 hours after eating. Limit high-fat foods such as fatty meats or fried foods. This information is not intended to replace advice given to you by your health care provider. Make sure you discuss any questions you have with your health care provider. Document Revised: 05/12/2020 Document Reviewed: 05/12/2020 Elsevier Patient Education  2022 [employer].

## 2021-09-14 DIAGNOSIS — K219 Gastro-esophageal reflux disease without esophagitis: Secondary | ICD-10-CM | POA: Insufficient documentation

## 2021-09-14 NOTE — Progress Notes (Signed)
Chief Complaint  Patient presents with   Follow-up   F/u  1. C/o gerd intermittently prilosec 40 mg qd helps would like refill  2. Obesity tried adipex 37.5 1/2 tablet but not losing weight trying to exercise and eat right wt unable to lose  3. R/o right flank pain x 1-1.5 weeks nothing tried declines imaging ie US abdomen for now will call back if continues  4. Right knee c/o pain at times and out of joint [facility] [place] and [place] they gave her a brace but she is not sure if for the correct knee Rx new brace for right knee. Out of joint happens when at her desk at work  Advised if continues f/u ortho  Review of Systems  Constitutional:  Negative for weight loss.  HENT:  Negative for hearing loss.   Eyes:  Negative for blurred vision.  Respiratory:  Negative for shortness of breath.   Cardiovascular:  Negative for chest pain.  Gastrointestinal:  Negative for abdominal pain.  Musculoskeletal:  Positive for joint pain.  Skin:  Negative for rash.  Neurological:  Negative for headaches.  Psychiatric/Behavioral:  Negative for depression.   Past Medical History:  Diagnosis Date   Abnormal Pap smear of cervix    COVID-19    10/2020   DVT (deep venous thrombosis) (HCC) 2014   right leg was on OCP   Migraine    in college    STD (female)    HSV, HPV   Past Surgical History:  Procedure Laterality Date   CERVICAL BIOPSY  W/ LOOP ELECTRODE EXCISION  2009   Family History  Problem Relation Age of Onset   Miscarriages / Stillbirths Mother    Edema Mother    Hypertension Father    Asthma Brother    Edema Brother        lymphedema   Peripheral Artery Disease Maternal Grandmother        with stent   Alcohol abuse Maternal Grandfather    Cancer Maternal Grandfather        lung cancer mets smoker   Alcohol abuse Paternal Grandfather    Cancer Paternal Grandfather        pancreatitc    Hypertension Paternal Grandfather    Social History   Socioeconomic  History   Marital status: Single    Spouse name: Not on file   Number of children: Not on file   Years of education: Not on file   Highest education level: Not on file  Occupational History   Not on file  Tobacco Use   Smoking status: Never   Smokeless tobacco: Never  Vaping Use   Vaping Use: Never used  Substance and Sexual Activity   Alcohol use: No    Alcohol/week: 0.0 standard drinks   Drug use: No   Sexual activity: Yes    Birth control/protection: None  Other Topics Concern   Not on file  Social History Narrative   [employer]    Single    1 daughter    Customer service dept      Never smoker    Owns guns, wears seat belt    Social Determinants of Health   Financial Resource Strain: Not on file  Food Insecurity: Not on file  Transportation Needs: Not on file  Physical Activity: Not on file  Stress: Not on file  Social Connections: Not on file  Intimate Partner Violence: Not on file   Current Meds  Medication  Sig   Cetirizine HCl 10 MG CAPS Take 10 mg by mouth as needed. Reported on 11/03/2015   fluticasone (FLONASE) 50 MCG/ACT nasal spray Place into both nostrils daily.   Multiple Vitamins-Minerals (WOMENS MULTIVITAMIN PO) Take by mouth daily. Vitafusion   Semaglutide,0.25 or 0.5MG /DOS, (OZEMPIC, 0.25 OR 0.5 MG/DOSE,) 2 MG/1.5ML SOPN Inject 0.5 mg into the skin once a week. Starting month 2 for 2 weeks (total 4 weeks) then call for increase if tolerated   VITAMIN D PO Take by mouth.   [DISCONTINUED] omeprazole (PRILOSEC) 40 MG capsule Take 1 capsule (40 mg total) by mouth daily. 30 minutes   [DISCONTINUED] phentermine (ADIPEX-P) 37.5 MG tablet Take 18.75 mg by mouth daily before breakfast. Half tablet daily   Allergies  Allergen Reactions   Amoxicillin Rash   No results found for this or any previous visit (from the past 2160 hour(s)). Objective  Body mass index is 32.89 kg/m. Wt Readings from Last 3 Encounters:  09/04/21 203 lb 12.8 oz (92.4 kg)   02/16/21 195 lb (88.5 kg)  11/05/20 206 lb (93.4 kg)   Temp Readings from Last 3 Encounters:  09/04/21 98.1 F (36.7 C) (Oral)  02/16/21 98.5 F (36.9 C) (Oral)  10/21/20 98.1 F (36.7 C) (Oral)   BP Readings from Last 3 Encounters:  09/04/21 120/84  02/16/21 (!) 133/92  11/05/20 100/60   Pulse Readings from Last 3 Encounters:  09/04/21 65  02/16/21 79  10/21/20 85    Physical Exam Vitals and nursing note reviewed.  Constitutional:      Appearance: Normal appearance. She is well-developed and well-groomed. She is obese.  HENT:     Head: Normocephalic and atraumatic.  Eyes:     Conjunctiva/sclera: Conjunctivae normal.     Pupils: Pupils are equal, round, and reactive to light.  Cardiovascular:     Rate and Rhythm: Normal rate and regular rhythm.     Heart sounds: Normal heart sounds. No murmur heard. Pulmonary:     Effort: Pulmonary effort is normal.     Breath sounds: Normal breath sounds.  Abdominal:     Tenderness: There is no abdominal tenderness. There is no right CVA tenderness or left CVA tenderness.  Skin:    General: Skin is warm and moist.  Neurological:     General: No focal deficit present.     Mental Status: She is alert and oriented to person, place, and time. Mental status is at baseline.     Gait: Gait normal.  Psychiatric:        Attention and Perception: Attention and perception normal.        Mood and Affect: Mood and affect normal.        Speech: Speech normal.        Behavior: Behavior normal. Behavior is cooperative.        Thought Content: Thought content normal.        Cognition and Memory: Cognition and memory normal.        Judgment: Judgment normal.    Assessment  Plan  Gastroesophageal reflux disease, unspecified whether esophagitis present - Plan: omeprazole (PRILOSEC) 40 MG capsule Vitamin D deficiency - Plan: Vitamin D (25 hydroxy)  Obesity (BMI 30-39.9) - Plan: Semaglutide,0.25 or 0.5MG /DOS, (OZEMPIC, 0.25 OR 0.5 MG/DOSE,) 2  MG/1.5ML SOPN Rec healthy diet and exercise  Given sample Ozempic 0.25 weekly x 4 weeks (given sample dose in office) then increase to 0.5 weekly if tolerating mom is on this and losing wt Stop adipex  for now  If needed will send in prn zofran   Chronic pain of right knee Rx right knee brace  If needed [facility]u [facility] [place] or [place]   Right flank pain  Consider US abdomen pt wants to hold for now ongoing for 1-1.5 weeks   HM cpe due 10/21/2021 or after  Fasting labs ordered [facility] Flu shot utd had 08/14/21 Tdap maybe had in 2016   covid vx 2/2 pfizer, moderna 1/1 (total 3) consider further shots in future total 4 rec general population   Declines STD h/o HPV and HSV since 20s (will check hep B sAb and hep C antibody)  Pap OB/GYN Dr. Tiburcio Pea sch 10/2020 10/30/20 s/p LEEP pap 10/30/18 neg neg HPV sch for f/u 11/05/19 ob/gyn -->f/u yearly [facility] for pap/pelvic due 10/2021 pap   Screening mammogram rec 35 y.o   colonscopy age 67 y.o   rec D3 2000 to 5000 IU daily  rec healthy diet and exercise     Provider: Dr. French Ana McLean-Scocuzza-Internal Medicine

## 2021-09-22 DIAGNOSIS — J111 Influenza due to unidentified influenza virus with other respiratory manifestations: Secondary | ICD-10-CM | POA: Diagnosis not present

## 2021-10-02 ENCOUNTER — Other Ambulatory Visit: Payer: Self-pay

## 2021-10-02 DIAGNOSIS — E669 Obesity, unspecified: Secondary | ICD-10-CM

## 2021-10-02 DIAGNOSIS — K219 Gastro-esophageal reflux disease without esophagitis: Secondary | ICD-10-CM

## 2021-10-02 MED ORDER — OZEMPIC (0.25 OR 0.5 MG/DOSE) 2 MG/1.5ML ~~LOC~~ SOPN
0.5000 mg | PEN_INJECTOR | SUBCUTANEOUS | 2 refills | Status: DC
Start: 2021-10-02 — End: 2021-10-19

## 2021-10-02 MED ORDER — OMEPRAZOLE 40 MG PO CPDR
40.0000 mg | DELAYED_RELEASE_CAPSULE | Freq: Every day | ORAL | 3 refills | Status: DC
Start: 2021-10-02 — End: 2023-10-18

## 2021-10-02 MED ORDER — OZEMPIC (0.25 OR 0.5 MG/DOSE) 2 MG/1.5ML ~~LOC~~ SOPN: 0.5000 mg | PEN_INJECTOR | SUBCUTANEOUS | 2 refills | Status: DC

## 2021-10-02 MED ORDER — OMEPRAZOLE 40 MG PO CPDR: 40.0000 mg | DELAYED_RELEASE_CAPSULE | Freq: Every day | ORAL | 3 refills | Status: DC

## 2021-10-16 DIAGNOSIS — Z0189 Encounter for other specified special examinations: Secondary | ICD-10-CM | POA: Diagnosis not present

## 2021-10-19 ENCOUNTER — Other Ambulatory Visit: Payer: Self-pay | Admitting: Internal Medicine

## 2021-10-19 DIAGNOSIS — E669 Obesity, unspecified: Secondary | ICD-10-CM

## 2021-10-19 MED ORDER — OZEMPIC (0.25 OR 0.5 MG/DOSE) 2 MG/1.5ML ~~LOC~~ SOPN: 0.5000 mg | PEN_INJECTOR | SUBCUTANEOUS | 2 refills | Status: DC

## 2021-10-20 DIAGNOSIS — Z713 Dietary counseling and surveillance: Secondary | ICD-10-CM | POA: Diagnosis not present

## 2021-10-20 DIAGNOSIS — Z043 Encounter for examination and observation following other accident: Secondary | ICD-10-CM | POA: Diagnosis not present

## 2021-10-22 ENCOUNTER — Encounter: Payer: BC Managed Care – PPO | Admitting: Internal Medicine

## 2021-11-03 DIAGNOSIS — J029 Acute pharyngitis, unspecified: Secondary | ICD-10-CM | POA: Diagnosis not present

## 2021-11-06 ENCOUNTER — Other Ambulatory Visit: Payer: Self-pay

## 2021-11-06 ENCOUNTER — Other Ambulatory Visit (HOSPITAL_COMMUNITY)
Admission: RE | Admit: 2021-11-06 | Discharge: 2021-11-06 | Disposition: A | Discharge status: Home / Self Care | Payer: BC Managed Care – PPO | Source: Ambulatory Visit | Attending: Obstetrics & Gynecology | Admitting: Obstetrics & Gynecology

## 2021-11-06 ENCOUNTER — Ambulatory Visit (INDEPENDENT_AMBULATORY_CARE_PROVIDER_SITE_OTHER): Payer: BC Managed Care – PPO | Admitting: Obstetrics & Gynecology

## 2021-11-06 ENCOUNTER — Encounter: Payer: Self-pay | Admitting: Obstetrics & Gynecology

## 2021-11-06 VITALS — BP 122/70 | Ht 65.0 in | Wt 194.0 lb

## 2021-11-06 DIAGNOSIS — Z30430 Encounter for insertion of intrauterine contraceptive device: Secondary | ICD-10-CM | POA: Diagnosis not present

## 2021-11-06 DIAGNOSIS — Z124 Encounter for screening for malignant neoplasm of cervix: Secondary | ICD-10-CM

## 2021-11-06 DIAGNOSIS — N946 Dysmenorrhea, unspecified: Secondary | ICD-10-CM

## 2021-11-06 DIAGNOSIS — Z01419 Encounter for gynecological examination (general) (routine) without abnormal findings: Secondary | ICD-10-CM

## 2021-11-06 NOTE — Progress Notes (Signed)
HPI:      [name] is a 35 y.o. G2P1001 who LMP was Patient's last menstrual period was 10/15/2021 (exact date)., she presents today for her annual examination. The patient has no complaints today other than worsening pain w periods, periods are reg and irreg in length.  Has not been on hormones for many years.  Prior DVT while on OCPs.   Has done well w Mirena in past.   The patient is not currently sexually active. Her last pap: was normal. The patient does perform self breast exams.  There is no notable family history of breast or ovarian cancer in her family.  The patient has regular exercise: yes.  The patient denies current symptoms of depression.    GYN History: Contraception: abstinence  PMHx: Past Medical History:  Diagnosis Date   Abnormal Pap smear of cervix    COVID-19    10/2020   DVT (deep venous thrombosis) ([place]) 2014   right leg was on OCP   Migraine    in college    STD (female)    HSV, HPV   Past Surgical History:  Procedure Laterality Date   CERVICAL BIOPSY  W/ LOOP ELECTRODE EXCISION  2009   Family History  Problem Relation Age of Onset   83 / Stillbirths Mother    Edema Mother    Hypertension Father    Asthma Brother    Edema Brother        lymphedema   Peripheral Artery Disease Maternal Grandmother        with stent   Alcohol abuse Maternal Grandfather    Cancer Maternal Grandfather        lung cancer mets smoker   Alcohol abuse Paternal Grandfather    Cancer Paternal Grandfather        pancreatitc    Hypertension Paternal Grandfather    Social History   Tobacco Use   Smoking status: Never   Smokeless tobacco: Never  Vaping Use   Vaping Use: Never used  Substance Use Topics   Alcohol use: No    Alcohol/week: 0.0 standard drinks   Drug use: No    Current Outpatient Medications:    fluticasone (FLONASE) 50 MCG/ACT nasal spray, Place into both nostrils daily., Disp: , Rfl:    Multiple Vitamins-Minerals (WOMENS  MULTIVITAMIN PO), Take by mouth daily. Vitafusion, Disp: , Rfl:    omeprazole (PRILOSEC) 40 MG capsule, Take 1 capsule (40 mg total) by mouth daily. 30 minutes, Disp: 90 capsule, Rfl: 3   Semaglutide,0.25 or 0.5MG /DOS, (OZEMPIC, 0.25 OR 0.5 MG/DOSE,) 2 MG/1.5ML SOPN, Inject 0.5 mg into the skin once a week. X 1 month then call for increase if tolerated, Disp: 1.5 mL, Rfl: 2   VITAMIN D PO, Take by mouth., Disp: , Rfl:    Cetirizine HCl 10 MG CAPS, Take 10 mg by mouth as needed. Reported on 11/03/2015 (Patient not taking: Reported on 11/06/2021), Disp: , Rfl:  Allergies: Amoxicillin  Review of Systems  Constitutional:  Negative for chills, fever and malaise/fatigue.  HENT:  Negative for congestion, sinus pain and sore throat.   Eyes:  Negative for blurred vision and pain.  Respiratory:  Negative for cough and wheezing.   Cardiovascular:  Negative for chest pain and leg swelling.  Gastrointestinal:  Negative for abdominal pain, constipation, diarrhea, heartburn, nausea and vomiting.  Genitourinary:  Negative for dysuria, frequency, hematuria and urgency.  Musculoskeletal:  Negative for back pain, joint pain, myalgias and neck pain.  Skin:  Negative for itching and rash.  Neurological:  Negative for dizziness, tremors and weakness.  Endo/Heme/Allergies:  Does not bruise/bleed easily.  Psychiatric/Behavioral:  Negative for depression. The patient is not nervous/anxious and does not have insomnia.    Objective: BP 122/70    Ht 5\' 5"  (1.651 m)    Wt 194 lb (88 kg)    LMP 10/15/2021 (Exact Date)    BMI 32.28 kg/m   Filed Weights   11/06/21 0958  Weight: 194 lb (88 kg)   Body mass index is 32.28 kg/m. Physical Exam Constitutional:      General: She is not in acute distress.    Appearance: She is well-developed.  Genitourinary:     Bladder, rectum and urethral meatus normal.     No lesions in the vagina.     Right Labia: No rash, tenderness or lesions.    Left Labia: No tenderness,  lesions or rash.    No vaginal bleeding.      Right Adnexa: not tender and no mass present.    Left Adnexa: not tender and no mass present.    No cervical motion tenderness, friability, lesion or polyp.     Uterus is not enlarged.     No uterine mass detected.    Pelvic exam was performed with patient in the lithotomy position.  Breasts:    Right: No mass, skin change or tenderness.     Left: No mass, skin change or tenderness.  HENT:     Head: Normocephalic and atraumatic. No laceration.     Right Ear: Hearing normal.     Left Ear: Hearing normal.     Mouth/Throat:     Pharynx: Uvula midline.  Eyes:     Pupils: Pupils are equal, round, and reactive to light.  Neck:     Thyroid: No thyromegaly.  Cardiovascular:     Rate and Rhythm: Normal rate and regular rhythm.     Heart sounds: No murmur heard.   No friction rub. No gallop.  Pulmonary:     Effort: Pulmonary effort is normal. No respiratory distress.     Breath sounds: Normal breath sounds. No wheezing.  Abdominal:     General: Bowel sounds are normal. There is no distension.     Palpations: Abdomen is soft.     Tenderness: There is no abdominal tenderness. There is no rebound.  Musculoskeletal:        General: Normal range of motion.     Cervical back: Normal range of motion and neck supple.  Neurological:     Mental Status: She is alert and oriented to person, place, and time.     Cranial Nerves: No cranial nerve deficit.  Skin:    General: Skin is warm and dry.  Psychiatric:        Judgment: Judgment normal.  Vitals reviewed.    Assessment:  ANNUAL EXAM 1. Women's annual routine gynecological examination   2. Screening for cervical cancer   3. Encounter for insertion of intrauterine contraceptive device (IUD) for non-contraception indication   4. Dysmenorrhea      Screening Plan:            1.  Cervical Screening-  Pap smear done today  2. Breast screening- Exam annually and mammogram>40 planned   3.  Labs managed by PCP/ work  4. Counseling for contraception: abstinence  [facility] - 11/06/21 1000       Pregnancy Intention Screening   Does the patient want to become  pregnant in the next year? No    Does the patient's partner want to become pregnant in the next year? No    Would the patient like to discuss contraceptive options today? No      Contraception Wrap Up   Current Method Abstinence    End Method Abstinence            The pregnancy intention screening data noted above was reviewed. Potential methods of contraception were discussed. The patient elected to proceed with Abstinence.   5. Encounter for insertion of intrauterine contraceptive device (IUD) for non-contraception indication See below  6. Dysmenorrhea Pt has worsening sx's this year Prior IUD w good results Prior OCPs, but developed DVT Ops for IUD again for period and pain assoc w periods control   IUD PROCEDURE NOTE:  Indiana Pozzi is a 35 y.o. G2P1001 here for IUD insertion. No GYN concerns.  Last pap smear was normal.  IUD Insertion Procedure Note Patient identified, informed consent performed, consent signed.   Discussed risks of irregular bleeding, cramping, infection, malpositioning or misplacement of the IUD outside the uterus which may require further procedure such as laparoscopy, risk of failure <1%. Time out was performed.  Urine pregnancy test negative.  A bimanual exam showed the uterus to be midposition.  Speculum placed in the vagina.  Cervix visualized.  Cleaned with Betadine x 2.  Grasped anteriorly with a single tooth tenaculum.  Uterus sounded to 8 cm.   Mirena IUD placed per manufacturer's recommendations.  Strings trimmed to 3 cm. Tenaculum was removed, good hemostasis noted.  Patient tolerated procedure well.   Patient was given post-procedure instructions.  She was advised to have backup contraception for one week.  Patient was also asked to check IUD strings periodically and follow up  in 4 weeks for IUD check.     F/U  Return in about 4 weeks (around 12/04/2021) for Follow up.  Barnett Applebaum, MD, [facility], [facility] 11/06/2021  10:20 AM

## 2021-11-06 NOTE — Patient Instructions (Signed)
Intrauterine Device Insertion, Care After This sheet gives you information about how to care for yourself after your procedure. Your health care provider may also give you more specific instructions. If you have problems or questions, contact your health care provider. What can I expect after the procedure? After the procedure, it is common to have: Cramps and pain in the abdomen. Bleeding. It may be light or heavy. This may last for a few days. Lower back pain. Dizziness. Headaches. Nausea. Follow these instructions at home:  Before resuming sexual activity, check to make sure that you can feel the IUD string or strings. You should be able to feel the end of the string below the opening of your cervix. If your IUD string is in place, you may resume sexual activity. If you had a hormonal IUD inserted more than 7 days after your most recent period started, you will need to use a backup method of birth control for 7 days after IUD insertion. Ask your health care provider whether this applies to you. Continue to check that the IUD is still in place by feeling for the strings after every menstrual period, or once a month. An IUD will not protect you from sexually transmitted infections (STIs). Use methods to prevent the exchange of body fluids between partners (barrier protection) every time you have sex. Barrier protection can be used during oral, vaginal, or anal sex. Commonly used barrier methods include: Female condom. Female condom. Dental dam. Take over-the-counter and prescription medicines only as told by your health care provider. Keep all follow-up visits as told by your health care provider. This is important. Contact a health care provider if: You feel light-headed or weak. You have any of the following problems with your IUD string or strings: The string bothers or hurts you or your sexual partner. You cannot feel the string. The string has gotten longer. You can feel the IUD in  your vagina. You think you may be pregnant, or you miss your menstrual period. You think you may have a sexually transmitted infection (STI). Get help right away if: You have flu-like symptoms, such as tiredness (fatigue) and muscle aches. You have a fever and chills. You have bleeding that is heavier or lasts longer than a normal menstrual cycle. You have abnormal or bad-smelling discharge from your vagina. You develop abdominal pain that is new, is getting worse, or is not in the same area of earlier cramping and pain. You have pain during sexual activity. Summary After the procedure, it is common to have cramps and pain in the abdomen. It is also common to have light bleeding or heavier bleeding that is like your menstrual period. Continue to check that the IUD is still in place by feeling for the strings after every menstrual period, or once a month. Keep all follow-up visits as told by your health care provider. This is important. Contact your health care provider if you have problems with your IUD strings, such as the string getting longer or bothering you or your sexual partner. This information is not intended to replace advice given to you by your health care provider. Make sure you discuss any questions you have with your health care provider. Document Revised: 10/23/2019 Document Reviewed: 10/23/2019 Elsevier Patient Education  2022 Elsevier Inc.  

## 2021-11-11 LAB — CYTOLOGY - PAP
Comment: NEGATIVE
Diagnosis: NEGATIVE
High risk HPV: NEGATIVE

## 2021-11-14 DIAGNOSIS — J069 Acute upper respiratory infection, unspecified: Secondary | ICD-10-CM | POA: Diagnosis not present

## 2021-12-07 ENCOUNTER — Encounter: Payer: Self-pay | Admitting: Internal Medicine

## 2021-12-07 NOTE — Telephone Encounter (Signed)
Lalida from RX benefits is calling for prior authorization for ozempic  #[phone] Fax # [phone]  Lalida said they are a reviewer and not cover my meds

## 2021-12-08 ENCOUNTER — Telehealth: Payer: Self-pay | Admitting: Internal Medicine

## 2021-12-08 NOTE — Telephone Encounter (Signed)
Inform pt she can go up to 0.5 weekly but insurance needs PA can you help and call patient They may not cover as she is not diabetic then do we need to send wegovy? Or try this  Also labs from 08/2021 pt has not had please have her do these [facility] and will add on A1C

## 2021-12-09 NOTE — Telephone Encounter (Signed)
See 12/07/21 Patient message encounter

## 2021-12-10 ENCOUNTER — Ambulatory Visit: Payer: BC Managed Care – PPO | Admitting: Obstetrics & Gynecology

## 2021-12-10 NOTE — Telephone Encounter (Signed)
Pt called wanting to get an update on a message that she sent from my chart

## 2021-12-11 ENCOUNTER — Other Ambulatory Visit: Payer: Self-pay | Admitting: Internal Medicine

## 2021-12-11 ENCOUNTER — Other Ambulatory Visit: Payer: Self-pay

## 2021-12-11 ENCOUNTER — Encounter: Payer: Self-pay | Admitting: Obstetrics & Gynecology

## 2021-12-11 ENCOUNTER — Ambulatory Visit: Payer: BC Managed Care – PPO | Admitting: Obstetrics & Gynecology

## 2021-12-11 VITALS — BP 120/80 | Ht 65.0 in | Wt 194.0 lb

## 2021-12-11 DIAGNOSIS — Z30431 Encounter for routine checking of intrauterine contraceptive device: Secondary | ICD-10-CM

## 2021-12-11 DIAGNOSIS — E669 Obesity, unspecified: Secondary | ICD-10-CM

## 2021-12-11 MED ORDER — SEMAGLUTIDE (1 MG/DOSE) 4 MG/3ML ~~LOC~~ SOPN: 1.0000 mg | PEN_INJECTOR | SUBCUTANEOUS | 5 refills | Status: DC

## 2021-12-11 NOTE — Progress Notes (Signed)
°  History of Present Illness:  Emily Griffin is a 36 y.o. that had a Mirena IUD placed approximately 4 weeks ago. Since that time, she states that she has had no pain.  She has had spotty or stringy type bloody discharge most days since insertion.  PMHx: She  has a past medical history of Abnormal Pap smear of cervix, COVID-19, DVT (deep venous thrombosis) (HCC) (2014), Migraine, and STD (female). Also,  has a past surgical history that includes Cervical biopsy w/ loop electrode excision (2009)., family history includes Alcohol abuse in her maternal grandfather and paternal grandfather; Asthma in her brother; Cancer in her maternal grandfather and paternal grandfather; Edema in her brother and mother; Hypertension in her father and paternal grandfather; Miscarriages / [place] in her mother; Peripheral Artery Disease in her maternal grandmother.,  reports that she has never smoked. She has never used smokeless tobacco. She reports that she does not drink alcohol and does not use drugs. No outpatient medications have been marked as taking for the 12/11/21 encounter (Office Visit) with Nadara Mustard, MD.  .  Also, is allergic to amoxicillin..  Review of Systems  All other systems reviewed and are negative.  Physical Exam:  There were no vitals taken for this visit. There is no height or weight on file to calculate BMI. Constitutional: Well nourished, well developed female in no acute distress.  Abdomen: diffusely non tender to palpation, non distended, and no masses, hernias Neuro: Grossly intact Psych:  Normal mood and affect.    Pelvic exam:  Two IUD strings present seen coming from the cervical os. EGBUS, vaginal vault and cervix: within normal limits  Assessment: IUD strings present in proper location; pt doing well  Plan: She was told to continue to use barrier contraception, in order to prevent any STIs, and to take a home pregnancy test or call us if she ever thinks she may be  pregnant, and that her IUD expires in 8 years.  She was amenable to this plan and we will see her back in 1 year/PRN.  A total of 20 minutes were spent face-to-face with the patient as well as preparation, review, communication, and documentation during this encounter.   Annamarie Major, MD, [facility], [facility] 12/11/2021  8:19 AM

## 2021-12-11 NOTE — Telephone Encounter (Signed)
Patient states that she has been on the 0.5 for a little over a month. She would like to go up to the next step. Please advise      Called Patient insurance and they state she does not have RX benefits with the current insurance. Ozempic not covered. Informed Patient of the possibility of coupon card. She would like the link sent via mychart.

## 2021-12-11 NOTE — Telephone Encounter (Signed)
See Patient message encounter  

## 2021-12-11 NOTE — Telephone Encounter (Signed)
December 08, 2021 McLean-Scocuzza, Pasty Spillers, MD to Me   TM    3:07 PM  Inform pt she can go up to 0.5 weekly but insurance needs PA can you help and call patient  They may not cover as she is not diabetic then do we need to send wegovy? Or try this  Also labs from 08/2021 pt has not had please have her do these [facility] and will add on A1C

## 2021-12-21 ENCOUNTER — Telehealth: Payer: Self-pay | Admitting: Internal Medicine

## 2021-12-21 NOTE — Telephone Encounter (Signed)
[employer] Emily Griffin called in requesting verification on patient demographics  ref# [id number]  please called Emily Griffin @ [phone]

## 2021-12-25 NOTE — Telephone Encounter (Signed)
Called and confirmed Patient information and RX benefits information. They will close the PA as there is no diagnosis of diabetes for the Ozempic on this Patient

## 2022-01-21 ENCOUNTER — Encounter: Payer: Self-pay | Admitting: Internal Medicine

## 2022-01-31 DIAGNOSIS — J0100 Acute maxillary sinusitis, unspecified: Secondary | ICD-10-CM | POA: Diagnosis not present

## 2022-03-05 ENCOUNTER — Encounter: Payer: Self-pay | Admitting: Internal Medicine

## 2022-03-05 ENCOUNTER — Ambulatory Visit: Payer: BC Managed Care – PPO | Admitting: Internal Medicine

## 2022-03-05 VITALS — BP 104/60 | HR 70 | Temp 98.0°F | Resp 14 | Ht 65.0 in | Wt 196.2 lb

## 2022-03-05 DIAGNOSIS — Z0000 Encounter for general adult medical examination without abnormal findings: Secondary | ICD-10-CM | POA: Diagnosis not present

## 2022-03-05 DIAGNOSIS — E669 Obesity, unspecified: Secondary | ICD-10-CM

## 2022-03-05 DIAGNOSIS — M79641 Pain in right hand: Secondary | ICD-10-CM | POA: Diagnosis not present

## 2022-03-05 DIAGNOSIS — G5601 Carpal tunnel syndrome, right upper limb: Secondary | ICD-10-CM | POA: Diagnosis not present

## 2022-03-05 DIAGNOSIS — Z6832 Body mass index (BMI) 32.0-32.9, adult: Secondary | ICD-10-CM

## 2022-03-05 MED ORDER — WEGOVY 2.4 MG/0.75ML ~~LOC~~ SOAJ: 2.4000 mg | SUBCUTANEOUS | 3 refills | Status: DC

## 2022-03-05 MED ORDER — WEGOVY 0.5 MG/0.5ML ~~LOC~~ SOAJ: 0.5000 mg | SUBCUTANEOUS | 0 refills | Status: DC

## 2022-03-05 MED ORDER — WEGOVY 0.25 MG/0.5ML ~~LOC~~ SOAJ: 0.2500 mg | SUBCUTANEOUS | 0 refills | Status: DC

## 2022-03-05 MED ORDER — WEGOVY 1.7 MG/0.75ML ~~LOC~~ SOAJ: 1.7000 mg | SUBCUTANEOUS | 0 refills | Status: DC

## 2022-03-05 MED ORDER — WEGOVY 1 MG/0.5ML ~~LOC~~ SOAJ: 1.0000 mg | SUBCUTANEOUS | 0 refills | Status: DC

## 2022-03-05 NOTE — Patient Instructions (Addendum)
[facility] [place] [facility]  ?Dr. Helane Rima wt loss clinic  ? ?[facility] coupon card  ? ? ?Ob/gyn  ?Dr. Dalbert Garnet or [facility]  ?[facility] Dr. Mitchel Honour ?[facility]  ?[facility]  ?-Dr. Normand Sloop  ? ?Semaglutide Injection (Weight Management) ?What is this medication? ?SEMAGLUTIDE (SEM a GLOO tide) promotes weight loss. It may also be used to maintain weight loss. It works by decreasing appetite. Changes to diet and exercise are often combined with this medication. ?This medicine may be used for other purposes; ask your health care provider or pharmacist if you have questions. ?COMMON BRAND NAME(S): Wegovy ?What should I tell my care team before I take this medication? ?They need to know if you have any of these conditions: ?Endocrine tumors (MEN 2) or if someone in your family had these tumors ?Eye disease, vision problems ?Gallbladder disease ?History of depression or mental health disease ?History of pancreatitis ?Kidney disease ?Stomach or intestine problems ?Suicidal thoughts, plans, or attempt; a previous suicide attempt by you or a family member ?Thyroid cancer or if someone in your family had thyroid cancer ?An unusual or allergic reaction to semaglutide, other medications, foods, dyes, or preservatives ?Pregnant or trying to get pregnant ?Breast-feeding ?How should I use this medication? ?This medication is injected under the skin. You will be taught how to prepare and give it. Take it as directed on the prescription label. It is given once every week (every 7 days). Keep taking it unless your care team tells you to stop. ?It is important that you put your used needles and pens in a special sharps container. Do not put them in a trash can. If you do not have a sharps container, call your pharmacist or care team to get one. ?A special MedGuide will be given to you by the pharmacist with each prescription and refill. Be sure to read this information carefully each  time. ?This medication comes with INSTRUCTIONS FOR USE. Ask your pharmacist for directions on how to use this medication. Read the information carefully. Talk to your pharmacist or care team if you have questions. ?Talk to your care team about the use of this medication in children. While it may be prescribed for children as young as 12 years for selected conditions, precautions do apply. ?Overdosage: If you think you have taken too much of this medicine contact a poison control center or emergency room at once. ?NOTE: This medicine is only for you. Do not share this medicine with others. ?What if I miss a dose? ?If you miss a dose and the next scheduled dose is more than 2 days away, take the missed dose as soon as possible. If you miss a dose and the next scheduled dose is less than 2 days away, do not take the missed dose. Take the next dose at your regular time. Do not take double or extra doses. If you miss your dose for 2 weeks or more, take the next dose at your regular time or call your care team to talk about how to restart this medication. ?What may interact with this medication? ?Insulin and other medications for diabetes ?This list may not describe all possible interactions. Give your health care provider a list of all the medicines, herbs, non-prescription drugs, or dietary supplements you use. Also tell them if you smoke, drink alcohol, or use illegal drugs. Some items may interact with your medicine. ?What should I watch for while using this medication? ?Visit your  care team for regular checks on your progress. It may be some time before you see the benefit from this medication. ?Drink plenty of fluids while taking this medication. Check with your care team if you have severe diarrhea, nausea, and vomiting, or if you sweat a lot. The loss of too much body fluid may make it dangerous for you to take this medication. ?This medication may affect blood sugar levels. Ask your care team if changes in diet  or medications are needed if you have diabetes. ?If you or your family notice any changes in your behavior, such as new or worsening depression, thoughts of harming yourself, anxiety, other unusual or disturbing thoughts, or memory loss, call your care team right away. ?Women should inform their care team if they wish to become pregnant or think they might be pregnant. Losing weight while pregnant is not advised and may cause harm to the unborn child. Talk to your care team for more information. ?What side effects may I notice from receiving this medication? ?Side effects that you should report to your care team as soon as possible: ?Allergic reactions--skin rash, itching, hives, swelling of the face, lips, tongue, or throat ?Change in vision ?Dehydration--increased thirst, dry mouth, feeling faint or lightheaded, headache, dark yellow or brown urine ?Gallbladder problems--severe stomach pain, nausea, vomiting, fever ?Heart palpitations--rapid, pounding, or irregular heartbeat ?Kidney injury--decrease in the amount of urine, swelling of the ankles, hands, or feet ?Pancreatitis--severe stomach pain that spreads to your back or gets worse after eating or when touched, fever, nausea, vomiting ?Thoughts of suicide or self-harm, worsening mood, feelings of depression ?Thyroid cancer--new mass or lump in the neck, pain or trouble swallowing, trouble breathing, hoarseness ?Side effects that usually do not require medical attention (report to your care team if they continue or are bothersome): ?Diarrhea ?Loss of appetite ?Nausea ?Stomach pain ?Vomiting ?This list may not describe all possible side effects. Call your doctor for medical advice about side effects. You may report side effects to FDA at 1-800-FDA-1088. ?Where should I keep my medication? ?Keep out of the reach of children and pets. ?Refrigeration (preferred): Store in the refrigerator. Do not freeze. Keep this medication in the original container until you are  ready to take it. Get rid of any unused medication after the expiration date. ?Room temperature: If needed, prior to cap removal, the pen can be stored at room temperature for up to 28 days. Protect from light. If it is stored at room temperature, get rid of any unused medication after 28 days or after it expires, whichever is first. ?It is important to get rid of the medication as soon as you no longer need it or it is expired. You can do this in two ways: ?Take the medication to a medication take-back program. Check with your pharmacy or law enforcement to find a location. ?If you cannot return the medication, follow the directions in the MedGuide. ?NOTE: This sheet is a summary. It may not cover all possible information. If you have questions about this medicine, talk to your doctor, pharmacist, or health care provider. ?? 2023 Elsevier/Gold Standard (2021-11-18 00:00:00) ? ?

## 2022-03-05 NOTE — Progress Notes (Signed)
Chief Complaint  ?Patient presents with  ? Follow-up  ?  6 mon, discuss other wt loss options has been off ozempic since February.   ? Annual Exam  ? ?Annual  ?1. Obesity disc wt loss ozempic not covered could not tolerate adipex in the past  ?2. Right hand pain saw [facility] in [place] in the past  ? ? ?Review of Systems  ?Constitutional:  Negative for weight loss.  ?HENT:  Negative for hearing loss.   ?Eyes:  Negative for blurred vision.  ?Respiratory:  Negative for shortness of breath.   ?Cardiovascular:  Negative for chest pain.  ?Gastrointestinal:  Negative for abdominal pain and blood in stool.  ?Genitourinary:  Negative for dysuria.  ?Musculoskeletal:  Negative for falls and joint pain.  ?Skin:  Negative for rash.  ?Neurological:  Negative for headaches.  ?Psychiatric/Behavioral:  Negative for depression.   ?Past Medical History:  ?Diagnosis Date  ? Abnormal Pap smear of cervix   ? COVID-19   ? 10/2020  ? DVT (deep venous thrombosis) (HCC) 2014  ? right leg was on OCP  ? Migraine   ? in college   ? STD (female)   ? HSV, HPV  ? ?Past Surgical History:  ?Procedure Laterality Date  ? CERVICAL BIOPSY  W/ LOOP ELECTRODE EXCISION  2009  ? ?Family History  ?Problem Relation Age of Onset  ? Miscarriages / [place] Mother   ? Edema Mother   ? Hypertension Father   ? Asthma Brother   ? Edema Brother   ?     lymphedema  ? Peripheral Artery Disease Maternal Grandmother   ?     with stent  ? Alcohol abuse Maternal Grandfather   ? Cancer Maternal Grandfather   ?     lung cancer mets smoker  ? Alcohol abuse Paternal Grandfather   ? Cancer Paternal Grandfather   ?     pancreatitc   ? Hypertension Paternal Grandfather   ? ?Social History  ? ?Socioeconomic History  ? Marital status: Single  ?  Spouse name: Not on file  ? Number of children: Not on file  ? Years of education: Not on file  ? Highest education level: Not on file  ?Occupational History  ? Not on file  ?Tobacco Use  ? Smoking status: Never  ? Smokeless tobacco:  Never  ?Vaping Use  ? Vaping Use: Never used  ?Substance and Sexual Activity  ? Alcohol use: No  ?  Alcohol/week: 0.0 standard drinks  ? Drug use: No  ? Sexual activity: Not Currently  ?  Birth control/protection: None  ?Other Topics Concern  ? Not on file  ?Social History Narrative  ? College [employer]   ? Single   ? 1 daughter   ? Customer service dept  ?   ? Never smoker   ? Owns guns, wears seat belt   ? ?Social Determinants of Health  ? ?Financial Resource Strain: Not on file  ?Food Insecurity: Not on file  ?Transportation Needs: Not on file  ?Physical Activity: Not on file  ?Stress: Not on file  ?Social Connections: Not on file  ?Intimate Partner Violence: Not on file  ? ?Current Meds  ?Medication Sig  ? Cetirizine HCl 10 MG CAPS Take 10 mg by mouth as needed. Reported on 11/03/2015  ? fluticasone (FLONASE) 50 MCG/ACT nasal spray Place into both nostrils daily.  ? levonorgestrel (MIRENA) 20 MCG/DAY IUD 1 each by Intrauterine route once.  ? Multiple Vitamins-Minerals (WOMENS MULTIVITAMIN  PO) Take by mouth daily. Vitafusion  ? omeprazole (PRILOSEC) 40 MG capsule Take 1 capsule (40 mg total) by mouth daily. 30 minutes  ? VITAMIN D PO Take by mouth.  ? [DISCONTINUED] Semaglutide-Weight Management (WEGOVY) 0.25 MG/0.5ML SOAJ Inject 0.25 mg into the skin once a week.  ? [DISCONTINUED] Semaglutide-Weight Management (WEGOVY) 0.5 MG/0.5ML SOAJ Inject 0.5 mg into the skin once a week.  ? [DISCONTINUED] Semaglutide-Weight Management (WEGOVY) 1 MG/0.5ML SOAJ Inject 1 mg into the skin once a week.  ? [DISCONTINUED] Semaglutide-Weight Management (WEGOVY) 1.7 MG/0.75ML SOAJ Inject 1.7 mg into the skin once a week.  ? [DISCONTINUED] Semaglutide-Weight Management (WEGOVY) 2.4 MG/0.75ML SOAJ Inject 2.4 mg into the skin once a week.  ? ?Allergies  ?Allergen Reactions  ? Amoxicillin Rash  ? ?No results found for this or any previous visit (from the past 2160 hour(s)). ?Objective  ?Body mass index is 32.65 kg/m?. ?Wt Readings from  Last 3 Encounters:  ?03/05/22 196 lb 3.2 oz (89 kg)  ?12/11/21 194 lb (88 kg)  ?11/06/21 194 lb (88 kg)  ? ?Temp Readings from Last 3 Encounters:  ?03/05/22 98 ?F (36.7 ?C) (Oral)  ?09/04/21 98.1 ?F (36.7 ?C) (Oral)  ?02/16/21 98.5 ?F (36.9 ?C) (Oral)  ? ?BP Readings from Last 3 Encounters:  ?03/05/22 104/60  ?12/11/21 120/80  ?11/06/21 122/70  ? ?Pulse Readings from Last 3 Encounters:  ?03/05/22 70  ?09/04/21 65  ?02/16/21 79  ? ? ?Physical Exam ?Vitals and nursing note reviewed.  ?Constitutional:   ?   Appearance: Normal appearance. She is well-developed and well-groomed.  ?HENT:  ?   Head: Normocephalic and atraumatic.  ?Eyes:  ?   Conjunctiva/sclera: Conjunctivae normal.  ?   Pupils: Pupils are equal, round, and reactive to light.  ?Cardiovascular:  ?   Rate and Rhythm: Normal rate and regular rhythm.  ?   Heart sounds: Normal heart sounds. No murmur heard. ?Pulmonary:  ?   Effort: Pulmonary effort is normal.  ?   Breath sounds: Normal breath sounds.  ?Abdominal:  ?   General: Abdomen is flat. Bowel sounds are normal.  ?   Tenderness: There is no abdominal tenderness.  ?Musculoskeletal:     ?   General: No tenderness.  ?Skin: ?   General: Skin is warm and dry.  ?Neurological:  ?   General: No focal deficit present.  ?   Mental Status: She is alert and oriented to person, place, and time. Mental status is at baseline.  ?   Cranial Nerves: Cranial nerves 2-12 are intact.  ?   Motor: Motor function is intact.  ?   Coordination: Coordination is intact.  ?   Gait: Gait is intact.  ?Psychiatric:     ?   Attention and Perception: Attention and perception normal.     ?   Mood and Affect: Mood and affect normal.     ?   Speech: Speech normal.     ?   Behavior: Behavior normal. Behavior is cooperative.     ?   Thought Content: Thought content normal.     ?   Cognition and Memory: Cognition and memory normal.     ?   Judgment: Judgment normal.  ? ? ?Assessment  ?Plan  ?Annual physical exam ?See below  ? ?Obesity (BMI  30-39.9) - Plan: Semaglutide-Weight Management (WEGOVY) 2.4 MG/0.75ML SOAJ, Semaglutide-Weight Management (WEGOVY) 1.7 MG/0.75ML SOAJ, Semaglutide-Weight Management (WEGOVY) 1 MG/0.5ML SOAJ, Semaglutide-Weight Management (WEGOVY) 0.5 MG/0.5ML SOAJ, Semaglutide-Weight Management (WEGOVY) 0.25  MG/0.5ML SOAJ, DISCONTINUED: Semaglutide-Weight Management (WEGOVY) 0.25 MG/0.5ML SOAJ, DISCONTINUED: Semaglutide-Weight Management (WEGOVY) 0.5 MG/0.5ML SOAJ, DISCONTINUED: Semaglutide-Weight Management (WEGOVY) 1 MG/0.5ML SOAJ, DISCONTINUED: Semaglutide-Weight Management (WEGOVY) 1.7 MG/0.75ML SOAJ, DISCONTINUED: Semaglutide-Weight Management (WEGOVY) 2.4 MG/0.75ML SOAJ ? ?Right hand pain ?Rx brace today hand brace  ?Carpal tunnel syndrome of right wrist  ? ?HM ?Fasting labs ordered [facility] ?Flu shot utd had 08/14/21 ?Tdap maybe had in 2016   ?covid vx 2/2 pfizer, moderna 1/1 (total 3) consider further shots in future total 4 rec general population ?  ?Declines STD h/o HPV and HSV since 20s (will check hep B sAb and hep C antibody)  ?Pap OB/GYN Dr. Tiburcio Pea sch 10/2020 10/30/20 s/p LEEP pap 10/30/18 neg neg HPV sch for f/u 11/05/19 ob/gyn ?-->f/u yearly [facility] for pap/pelvic due 10/2021 pap  ?  ?Screening mammogram rec 37 y.o  ?  ?colonscopy age 27 y.o  ?  ?rec D3 2000 to 5000 IU daily  ?rec healthy diet and exercise  ?  ? ?Provider: Dr. French Ana McLean-Scocuzza-Internal Medicine  ?

## 2022-03-11 ENCOUNTER — Telehealth: Payer: Self-pay | Admitting: Internal Medicine

## 2022-03-11 NOTE — Telephone Encounter (Signed)
Pt would like to be called regarding her medication 

## 2022-03-16 ENCOUNTER — Encounter: Payer: Self-pay | Admitting: Internal Medicine

## 2022-03-16 DIAGNOSIS — Z6832 Body mass index (BMI) 32.0-32.9, adult: Secondary | ICD-10-CM | POA: Insufficient documentation

## 2022-03-16 HISTORY — DX: Body mass index (BMI) 32.0-32.9, adult: Z68.32

## 2022-03-18 ENCOUNTER — Encounter: Payer: Self-pay | Admitting: Internal Medicine

## 2022-03-19 ENCOUNTER — Other Ambulatory Visit: Payer: Self-pay | Admitting: Internal Medicine

## 2022-03-19 DIAGNOSIS — Z6832 Body mass index (BMI) 32.0-32.9, adult: Secondary | ICD-10-CM

## 2022-03-19 DIAGNOSIS — E669 Obesity, unspecified: Secondary | ICD-10-CM

## 2022-03-19 MED ORDER — WEGOVY 2.4 MG/0.75ML ~~LOC~~ SOAJ: 2.4000 mg | SUBCUTANEOUS | 3 refills | Status: DC

## 2022-03-19 MED ORDER — WEGOVY 1 MG/0.5ML ~~LOC~~ SOAJ: 1.0000 mg | SUBCUTANEOUS | 0 refills | Status: DC

## 2022-03-19 MED ORDER — WEGOVY 0.25 MG/0.5ML ~~LOC~~ SOAJ: 0.2500 mg | SUBCUTANEOUS | 0 refills | Status: DC

## 2022-03-19 MED ORDER — WEGOVY 1.7 MG/0.75ML ~~LOC~~ SOAJ: 1.7000 mg | SUBCUTANEOUS | 0 refills | Status: DC

## 2022-03-19 MED ORDER — WEGOVY 0.5 MG/0.5ML ~~LOC~~ SOAJ: 0.5000 mg | SUBCUTANEOUS | 0 refills | Status: DC

## 2022-03-23 ENCOUNTER — Telehealth: Payer: Self-pay

## 2022-03-23 NOTE — Telephone Encounter (Signed)
Initiated prior authorization for pt's Semaglutide-Weight Management (WEGOVY) 0.25 MG/0.5ML SOAJ via [email or web address] on 03/23/22 ? ?Prior Auth (EOC) [id number] Drug/Service Name:WEGOVY 0.25 MG/0.5 ML PEN ?Patient:Emily Griffin Date Requested:03/23/2022 4:49:46 PM   ?PIRJJOAC:[id number] DOB: December 14, 1985 ? ?Awaiting denial or approval.  ? ?

## 2022-03-25 NOTE — Telephone Encounter (Signed)
Received fax from [employer] in regards to pt's Semaglutide-Weight Management (WEGOVY) 0.25 MG/0.5ML SOAJ  ? ?It has been Approved effective from 03/23/22 to 10/23/22 ?

## 2022-04-04 DIAGNOSIS — H109 Unspecified conjunctivitis: Secondary | ICD-10-CM | POA: Diagnosis not present

## 2022-05-16 DIAGNOSIS — J069 Acute upper respiratory infection, unspecified: Secondary | ICD-10-CM | POA: Diagnosis not present

## 2022-06-02 ENCOUNTER — Encounter: Payer: Self-pay | Admitting: Internal Medicine

## 2022-06-08 ENCOUNTER — Other Ambulatory Visit: Payer: Self-pay | Admitting: Internal Medicine

## 2022-06-08 ENCOUNTER — Telehealth: Payer: Self-pay

## 2022-06-08 DIAGNOSIS — Z6832 Body mass index (BMI) 32.0-32.9, adult: Secondary | ICD-10-CM

## 2022-06-08 DIAGNOSIS — E669 Obesity, unspecified: Secondary | ICD-10-CM

## 2022-06-08 MED ORDER — INSULIN PEN NEEDLE 30G X 8 MM MISC: 1.0000 | 3 refills | Status: DC | PRN

## 2022-06-08 MED ORDER — SAXENDA 18 MG/3ML ~~LOC~~ SOPN: 0.6000 mg | PEN_INJECTOR | Freq: Every day | SUBCUTANEOUS | 3 refills | Status: DC

## 2022-06-08 NOTE — Telephone Encounter (Signed)
Started PA via [email or web address] on 06/08/22 for pt's SAXENDA 18 MG/3 ML PEN  Prior Auth (EOC) [id number] Drug/Service Name:SAXENDA 18 MG/3 ML PEN Patient:Emily Griffin Date Requested:06/08/2022 4:26:04 PM   MemberID:[id number] DOB:May 23, 1986  Awaiting denial or approval

## 2022-07-06 DIAGNOSIS — R5383 Other fatigue: Secondary | ICD-10-CM | POA: Diagnosis not present

## 2022-07-06 DIAGNOSIS — M2550 Pain in unspecified joint: Secondary | ICD-10-CM | POA: Diagnosis not present

## 2022-07-08 DIAGNOSIS — Z7189 Other specified counseling: Secondary | ICD-10-CM | POA: Diagnosis not present

## 2022-07-09 ENCOUNTER — Ambulatory Visit: Payer: BC Managed Care – PPO | Admitting: Internal Medicine

## 2022-07-09 ENCOUNTER — Encounter: Payer: Self-pay | Admitting: Internal Medicine

## 2022-07-09 ENCOUNTER — Ambulatory Visit
Admission: RE | Admit: 2022-07-09 | Discharge: 2022-07-09 | Disposition: A | Discharge status: Home / Self Care | Payer: BC Managed Care – PPO | Source: Ambulatory Visit | Attending: Internal Medicine | Admitting: Internal Medicine

## 2022-07-09 VITALS — BP 118/62 | HR 68 | Temp 98.1°F | Resp 22 | Ht 65.0 in | Wt 220.6 lb

## 2022-07-09 DIAGNOSIS — Z975 Presence of (intrauterine) contraceptive device: Secondary | ICD-10-CM

## 2022-07-09 DIAGNOSIS — R600 Localized edema: Secondary | ICD-10-CM | POA: Insufficient documentation

## 2022-07-09 DIAGNOSIS — R0602 Shortness of breath: Secondary | ICD-10-CM

## 2022-07-09 DIAGNOSIS — R635 Abnormal weight gain: Secondary | ICD-10-CM

## 2022-07-09 DIAGNOSIS — Z113 Encounter for screening for infections with a predominantly sexual mode of transmission: Secondary | ICD-10-CM

## 2022-07-09 DIAGNOSIS — U071 COVID-19: Secondary | ICD-10-CM

## 2022-07-09 DIAGNOSIS — E559 Vitamin D deficiency, unspecified: Secondary | ICD-10-CM

## 2022-07-09 DIAGNOSIS — I890 Lymphedema, not elsewhere classified: Secondary | ICD-10-CM

## 2022-07-09 DIAGNOSIS — R748 Abnormal levels of other serum enzymes: Secondary | ICD-10-CM | POA: Diagnosis not present

## 2022-07-09 DIAGNOSIS — R739 Hyperglycemia, unspecified: Secondary | ICD-10-CM

## 2022-07-09 DIAGNOSIS — R079 Chest pain, unspecified: Secondary | ICD-10-CM | POA: Insufficient documentation

## 2022-07-09 DIAGNOSIS — N938 Other specified abnormal uterine and vaginal bleeding: Secondary | ICD-10-CM

## 2022-07-09 DIAGNOSIS — R0789 Other chest pain: Secondary | ICD-10-CM | POA: Diagnosis not present

## 2022-07-09 DIAGNOSIS — J9801 Acute bronchospasm: Secondary | ICD-10-CM

## 2022-07-09 DIAGNOSIS — J40 Bronchitis, not specified as acute or chronic: Secondary | ICD-10-CM

## 2022-07-09 DIAGNOSIS — R140 Abdominal distension (gaseous): Secondary | ICD-10-CM | POA: Diagnosis not present

## 2022-07-09 DIAGNOSIS — Z13220 Encounter for screening for lipoid disorders: Secondary | ICD-10-CM

## 2022-07-09 MED ORDER — IOHEXOL 350 MG/ML SOLN
75.0000 mL | Freq: Once | INTRAVENOUS | Status: AC | PRN
  Administered 2022-07-09: 75 mL via INTRAVENOUS

## 2022-07-09 MED ORDER — ALBUTEROL SULFATE HFA 108 (90 BASE) MCG/ACT IN AERS: 2.0000 | INHALATION_SPRAY | Freq: Four times a day (QID) | RESPIRATORY_TRACT | 2 refills | Status: AC | PRN

## 2022-07-09 MED ORDER — FUROSEMIDE 20 MG PO TABS
20.0000 mg | ORAL_TABLET | Freq: Every day | ORAL | 0 refills | Status: DC
Start: 2022-07-09 — End: 2022-07-21

## 2022-07-09 NOTE — Progress Notes (Addendum)
Chief Complaint  Patient presents with   Shortness of Breath    Pt has had shortness of breath 3-4 weeks. 2 months ago pt went to [place] and tested positive for Cov[place]. Pt has not retested since then. Pt also experiencing fluid on her legs and feet.    F/u  1. Atypical chest pain and Sob with exertion x 3 weeks and wt gain 30 lbs in 2 weeks with ab bloating and leg edema h/o DVT and hurting to take a deep breath in back and chest pressure she had covid at the end of 04/2022 when went to [place] and also traveled via flight end of 04/2022  She tried albuterol inhaler w/o relief. Has trouble lying flat in bed and sob worse at night   Review of Systems  Constitutional:  Negative for weight loss.  HENT:  Negative for hearing loss.   Eyes:  Negative for blurred vision.  Respiratory:  Positive for shortness of breath.   Cardiovascular:  Positive for chest pain and leg swelling.  Gastrointestinal:  Negative for abdominal pain and blood in stool.  Genitourinary:  Negative for dysuria.  Musculoskeletal:  Negative for falls and joint pain.  Skin:  Negative for rash.  Neurological:  Negative for headaches.  Psychiatric/Behavioral:  Negative for depression.    Past Medical History:  Diagnosis Date   Abnormal Pap smear of cervix    COVID-19    10/2020, 04/2022 end   DVT (deep venous thrombosis) (HCC) 2014   right leg was on OCP   Migraine    in college    STD (female)    HSV, HPV   Past Surgical History:  Procedure Laterality Date   CERVICAL BIOPSY  W/ LOOP ELECTRODE EXCISION  2009   Family History  Problem Relation Age of Onset   Miscarriages / Stillbirths Mother    Edema Mother    Hypertension Father    Asthma Brother    Edema Brother        lymphedema   Peripheral Artery Disease Maternal Grandmother        with stent   Alcohol abuse Maternal Grandfather    Cancer Maternal Grandfather        lung cancer mets smoker   Alcohol abuse Paternal Grandfather    Cancer Paternal  Grandfather        pancreatitc    Hypertension Paternal Grandfather    Social History   Socioeconomic History   Marital status: Single    Spouse name: Not on file   Number of children: Not on file   Years of education: Not on file   Highest education level: Not on file  Occupational History   Not on file  Tobacco Use   Smoking status: Never   Smokeless tobacco: Never  Vaping Use   Vaping Use: Never used  Substance and Sexual Activity   Alcohol use: No    Alcohol/week: 0.0 standard drinks of alcohol   Drug use: No   Sexual activity: Not Currently    Birth control/protection: None  Other Topics Concern   Not on file  Social History Narrative   College [employer]    Single    1 daughter    Customer service dept      Never smoker    Owns guns, wears seat belt    Social Determinants of Health   Financial Resource Strain: Not on file  Food Insecurity: Not on file  Transportation Needs: Not on file  Physical Activity: Not  on file  Stress: Not on file  Social Connections: Not on file  Intimate Partner Violence: Not on file   Current Meds  Medication Sig   Cetirizine HCl 10 MG CAPS Take 10 mg by mouth as needed. Reported on 11/03/2015   fluticasone (FLONASE) 50 MCG/ACT nasal spray Place into both nostrils daily.   furosemide (LASIX) 20 MG tablet Take 1 tablet (20 mg total) by mouth daily.   Insulin Pen Needle (NOVOFINE) 30G X 8 MM MISC Inject 10 each into the skin as needed. Generic ok or substitution   levonorgestrel (MIRENA) 20 MCG/DAY IUD 1 each by Intrauterine route once.   Liraglutide -Weight Management (SAXENDA) 18 MG/3ML SOPN Inject 0.6 mg into the skin daily. Qd x 1 week, 1.2 qd x 1 week, 1.8 qd x 1 week, 2.4 qd x 1 week, 3.0 qd x 1 week   Multiple Vitamins-Minerals (WOMENS MULTIVITAMIN PO) Take by mouth daily. Vitafusion   omeprazole (PRILOSEC) 40 MG capsule Take 1 capsule (40 mg total) by mouth daily. 30 minutes   Semaglutide-Weight Management (WEGOVY) 0.25  MG/0.5ML SOAJ Inject 0.25 mg into the skin once a week.   Semaglutide-Weight Management (WEGOVY) 0.5 MG/0.5ML SOAJ Inject 0.5 mg into the skin once a week.   Semaglutide-Weight Management (WEGOVY) 1 MG/0.5ML SOAJ Inject 1 mg into the skin once a week.   Semaglutide-Weight Management (WEGOVY) 1.7 MG/0.75ML SOAJ Inject 1.7 mg into the skin once a week.   Semaglutide-Weight Management (WEGOVY) 2.4 MG/0.75ML SOAJ Inject 2.4 mg into the skin once a week.   VITAMIN D PO Take by mouth.   Allergies  Allergen Reactions   Amoxicillin Rash   No results found for this or any previous visit (from the past 2160 hour(s)). Objective  Body mass index is 36.71 kg/m. Wt Readings from Last 3 Encounters:  07/09/22 220 lb 9.6 oz (100.1 kg)  03/05/22 196 lb 3.2 oz (89 kg)  12/11/21 194 lb (88 kg)   Temp Readings from Last 3 Encounters:  07/09/22 98.1 F (36.7 C) (Oral)  03/05/22 98 F (36.7 C) (Oral)  09/04/21 98.1 F (36.7 C) (Oral)   BP Readings from Last 3 Encounters:  07/09/22 118/62  03/05/22 104/60  12/11/21 120/80   Pulse Readings from Last 3 Encounters:  07/09/22 68  03/05/22 70  09/04/21 65    Physical Exam Vitals and nursing note reviewed.  Constitutional:      Appearance: Normal appearance. She is well-developed and well-groomed.  HENT:     Head: Normocephalic and atraumatic.  Eyes:     Conjunctiva/sclera: Conjunctivae normal.     Pupils: Pupils are equal, round, and reactive to light.  Cardiovascular:     Rate and Rhythm: Normal rate and regular rhythm.     Heart sounds: Normal heart sounds. No murmur heard.    Comments: Nonpitting leg edema b/l legs   Pulmonary:     Effort: Pulmonary effort is normal.     Breath sounds: Normal breath sounds.  Abdominal:     General: Abdomen is flat. Bowel sounds are normal.     Tenderness: There is no abdominal tenderness.  Musculoskeletal:        General: No tenderness.  Skin:    General: Skin is warm and dry.  Neurological:      General: No focal deficit present.     Mental Status: She is alert and oriented to person, place, and time. Mental status is at baseline.     Cranial Nerves: Cranial nerves 2-12  are intact.     Motor: Motor function is intact.     Coordination: Coordination is intact.     Gait: Gait is intact.  Psychiatric:        Attention and Perception: Attention and perception normal.        Mood and Affect: Mood and affect normal.        Speech: Speech normal.        Behavior: Behavior normal. Behavior is cooperative.        Thought Content: Thought content normal.        Cognition and Memory: Cognition and memory normal.        Judgment: Judgment normal.     Assessment  Plan  Atypical chest pain pleuritic and SOB r/o PE with h/o DVT and recent h/o covid 19 - Plan: CT Angio Chest Pulmonary Embolism (PE) W or WO Contrast, ECHOCARDIOGRAM COMPLETE, EKG 12-Lead EKG NSR   Elevated liver enzymes r/o cardiac etiology could have been due to covid had end 04/2022, r/o infection - Plan: Hepatitis A Ab, Total, Hepatitis B surface antibody,quantitative, Hepatitis B Surface AntiGEN, Hepatitis C antibody/HIV  Abdominal bloating and elevated lfts ast 124 and alt 42 07/06/22 Consider abdominal imaging [place] if lfts do not normalize    Weight gain - Plan: Hepatic function panel, TSH,   IUD (intrauterine device) in place refer to [facility] ob[facility] to get checked out concerned with bleeding   Leg edema r/o DVT h/o DVT - Plan: furosemide (LASIX) 20 MG tablet, US Venous Img Lower Bilateral  HM  See last visit    Provider: Dr. French Ana McLean-Scocuzza-Internal Medicine

## 2022-07-09 NOTE — Addendum Note (Signed)
Addended by: Quentin Ore on: 07/09/2022 05:09 PM   Modules accepted: Orders

## 2022-07-09 NOTE — Patient Instructions (Signed)
MD Physician   Primary Contact Information  Phone Fax E-mail Address  [phone] Not available [address]   [place] [address]     Specialties     Obstetrics and Gynecology

## 2022-07-12 ENCOUNTER — Telehealth: Payer: Self-pay

## 2022-07-12 ENCOUNTER — Encounter: Payer: Self-pay | Admitting: Internal Medicine

## 2022-07-12 NOTE — Addendum Note (Signed)
Addended by: Quentin Ore on: 07/12/2022 03:48 PM   Modules accepted: Orders

## 2022-07-12 NOTE — Telephone Encounter (Signed)
Tomorrow is ok prefer fasting 8 hours nothing but water  Can we schedule echo Emily Griffin? Is lasix helping Emily Griffin?

## 2022-07-12 NOTE — Addendum Note (Signed)
Addended by: Quentin Ore on: 07/12/2022 09:13 PM   Modules accepted: Orders

## 2022-07-12 NOTE — Telephone Encounter (Signed)
Spoke with pt in regards to her labs and to notify her that I would be mailing her lab orders and her AVS to her and that she can go get her labs done today. Pt was wondering if she still should go today or wait until she has been fasting pt stated she had a coffee with sweet and low and whip cream.

## 2022-07-13 DIAGNOSIS — Z13220 Encounter for screening for lipoid disorders: Secondary | ICD-10-CM | POA: Diagnosis not present

## 2022-07-13 DIAGNOSIS — R748 Abnormal levels of other serum enzymes: Secondary | ICD-10-CM | POA: Diagnosis not present

## 2022-07-13 DIAGNOSIS — Z113 Encounter for screening for infections with a predominantly sexual mode of transmission: Secondary | ICD-10-CM | POA: Diagnosis not present

## 2022-07-13 DIAGNOSIS — R635 Abnormal weight gain: Secondary | ICD-10-CM | POA: Diagnosis not present

## 2022-07-13 DIAGNOSIS — E559 Vitamin D deficiency, unspecified: Secondary | ICD-10-CM | POA: Diagnosis not present

## 2022-07-13 DIAGNOSIS — R739 Hyperglycemia, unspecified: Secondary | ICD-10-CM | POA: Diagnosis not present

## 2022-07-14 ENCOUNTER — Telehealth: Payer: Self-pay

## 2022-07-14 LAB — HEPATIC FUNCTION PANEL
ALT: 28 IU/L (ref 0–32)
AST: 21 IU/L (ref 0–40)
Albumin: 4.7 g/dL (ref 3.9–4.9)
Alkaline Phosphatase: 81 IU/L (ref 44–121)
Bilirubin Total: 0.4 mg/dL (ref 0.0–1.2)
Bilirubin, Direct: 0.12 mg/dL (ref 0.00–0.40)
Total Protein: 7.2 g/dL (ref 6.0–8.5)

## 2022-07-14 LAB — HEMOGLOBIN A1C
Est. average glucose Bld gHb Est-mCnc: 108 mg/dL
Hgb A1c MFr Bld: 5.4 % (ref 4.8–5.6)

## 2022-07-14 LAB — HEPATITIS C ANTIBODY: Hep C Virus Ab: NONREACTIVE

## 2022-07-14 LAB — HEPATITIS B SURFACE ANTIGEN: Hepatitis B Surface Ag: NEGATIVE

## 2022-07-14 LAB — HEPATITIS B SURFACE ANTIBODY, QUANTITATIVE: Hepatitis B Surf Ab Quant: 246 m[IU]/mL (ref >9.9)

## 2022-07-14 LAB — LIPID PANEL
Chol/HDL Ratio: 3.2 ratio (ref 0.0–4.4)
Cholesterol, Total: 181 mg/dL (ref 100–199)
HDL: 56 mg/dL (ref >39)
LDL Chol Calc (NIH): 113 mg/dL — ABNORMAL HIGH (ref 0–99)
Triglycerides: 63 mg/dL (ref 0–149)
VLDL Cholesterol Cal: 12 mg/dL (ref 5–40)

## 2022-07-14 LAB — HIV ANTIBODY (ROUTINE TESTING W REFLEX): HIV Screen 4th Generation wRfx: NONREACTIVE

## 2022-07-14 LAB — VITAMIN D 25 HYDROXY (VIT D DEFICIENCY, FRACTURES): Vit D, 25-Hydroxy: 41.4 ng/mL (ref 30.0–100.0)

## 2022-07-14 LAB — TSH: TSH: 1.33 u[IU]/mL (ref 0.450–4.500)

## 2022-07-14 LAB — HEPATITIS A ANTIBODY, TOTAL: hep A Total Ab: NEGATIVE

## 2022-07-14 NOTE — Telephone Encounter (Signed)
Patient states she is returning Donavan Foil, CMA's call regarding her results.  I read Marvell's note to patient.

## 2022-07-14 NOTE — Telephone Encounter (Signed)
LMOM to Cb in regards to labs:   McLean-Scocuzza, Pasty Spillers, MD  Donavan Foil, CMA Ldl cholesterol 113 as you get older the goal is <100 for now ldl goal <130  Vitamin D normal  A1 5.4  Liver enzymes normal could have been elevated due to covid  Consider hep A vaccine x 2 doses 2nd dose in 6-12 months  Hep B protected  Hep C negative  HIV negative  Thyroid lab normal   Echo pending

## 2022-07-20 ENCOUNTER — Ambulatory Visit
Admission: RE | Admit: 2022-07-20 | Discharge: 2022-07-20 | Disposition: A | Discharge status: Home / Self Care | Payer: BC Managed Care – PPO | Source: Ambulatory Visit | Attending: Internal Medicine | Admitting: Internal Medicine

## 2022-07-20 DIAGNOSIS — R079 Chest pain, unspecified: Secondary | ICD-10-CM | POA: Diagnosis not present

## 2022-07-20 DIAGNOSIS — R0789 Other chest pain: Secondary | ICD-10-CM

## 2022-07-20 DIAGNOSIS — R0602 Shortness of breath: Secondary | ICD-10-CM | POA: Diagnosis not present

## 2022-07-20 LAB — ECHOCARDIOGRAM COMPLETE
AR max vel: 1.92 cm2
AV Area VTI: 1.87 cm2
AV Area mean vel: 2.01 cm2
AV Mean grad: 3 mmHg
AV Peak grad: 4.8 mmHg
Ao pk vel: 1.09 m/s
Area-P 1/2: 7.09 cm2
S' Lateral: 3.24 cm

## 2022-07-21 ENCOUNTER — Encounter: Payer: Self-pay | Admitting: Internal Medicine

## 2022-07-21 ENCOUNTER — Other Ambulatory Visit: Payer: Self-pay | Admitting: Internal Medicine

## 2022-07-21 DIAGNOSIS — R600 Localized edema: Secondary | ICD-10-CM

## 2022-07-21 DIAGNOSIS — I890 Lymphedema, not elsewhere classified: Secondary | ICD-10-CM | POA: Insufficient documentation

## 2022-07-21 MED ORDER — FUROSEMIDE 20 MG PO TABS: 20.0000 mg | ORAL_TABLET | Freq: Every day | ORAL | 5 refills | Status: DC

## 2022-07-21 NOTE — Addendum Note (Signed)
Addended by: Quentin Ore on: 07/21/2022 05:00 PM   Modules accepted: Orders

## 2022-08-02 ENCOUNTER — Other Ambulatory Visit: Payer: Self-pay | Admitting: Internal Medicine

## 2022-08-02 DIAGNOSIS — E669 Obesity, unspecified: Secondary | ICD-10-CM

## 2022-08-02 MED ORDER — PHENTERMINE HCL 37.5 MG PO TABS: 18.7500 mg | ORAL_TABLET | Freq: Every day | ORAL | 0 refills | Status: DC

## 2022-08-11 ENCOUNTER — Telehealth: Payer: Self-pay

## 2022-08-11 NOTE — Telephone Encounter (Signed)
[facility] PA form has been completed and faxed to: [phone]   Drug: Phentermine HCl 37.5mg   It has been faxed today 9/27  with a confirmed transmission log.

## 2022-08-13 NOTE — Progress Notes (Signed)
MRN : [id number]  Emily Griffin is a 36 y.o. (Apr 17, 1986) female who presents with chief complaint of legs swell.  History of Present Illness:   Patient is seen for evaluation of leg swelling. The patient first noticed the swelling remotely but is now concerned because of a significant increase in the overall edema. The swelling isn't associated with significant pain.  There has been an increasing amount of  discoloration noted by the patient. The patient notes that in the morning the legs are improved but they steadily worsened throughout the course of the day. Elevation seems to make the swelling of the legs better, dependency makes them much worse.   There is no history of ulcerations associated with the swelling.   The patient denies any recent changes in their medications.  The patient has not been wearing graduated compression.  The patient has no had any past angiography, interventions or vascular surgery.  The patient has a history of DVT in 2014, no history of PE. There is no prior history of superficial phlebitis.  There is no history of primary lymphedema.  There is no history of radiation treatment to the groin or pelvis No history of malignancies. No history of trauma or groin or pelvic surgery. No history of foreign travel or parasitic infections area    Duplex ultrasound is negative for DVT no superficial reflux identified.  No outpatient medications have been marked as taking for the 08/16/22 encounter (Appointment) with Gilda Crease, Latina Craver, MD.    Past Medical History:  Diagnosis Date   Abnormal Pap smear of cervix    COVID-19    10/2020, 04/2022 end   DVT (deep venous thrombosis) (HCC) 2014   right leg was on OCP   Migraine    in college    STD (female)    HSV, HPV    Past Surgical History:  Procedure Laterality Date   CERVICAL BIOPSY  W/ LOOP ELECTRODE EXCISION  2009    Social History Social History   Tobacco Use   Smoking status: Never    Smokeless tobacco: Never  Vaping Use   Vaping Use: Never used  Substance Use Topics   Alcohol use: No    Alcohol/week: 0.0 standard drinks of alcohol   Drug use: No    Family History Family History  Problem Relation Age of Onset   Miscarriages / Stillbirths Mother    Edema Mother    Hypertension Father    Asthma Brother    Edema Brother        lymphedema   Peripheral Artery Disease Maternal Grandmother        with stent   Alcohol abuse Maternal Grandfather    Cancer Maternal Grandfather        lung cancer mets smoker   Alcohol abuse Paternal Grandfather    Cancer Paternal Grandfather        pancreatitc    Hypertension Paternal Grandfather     Allergies  Allergen Reactions   Amoxicillin Rash     REVIEW OF SYSTEMS (Negative unless checked)  Constitutional: [] Weight loss  [] Fever  [] Chills Cardiac: [] Chest pain   [] Chest pressure   [] Palpitations   [] Shortness of breath when laying flat   [] Shortness of breath with exertion. Vascular:  [] Pain in legs with walking   [x] Pain in legs with standing  [x] History of DVT   [] Phlebitis   [x] Swelling in legs   [] Varicose veins   [] Non-healing ulcers Pulmonary:   [] Uses home oxygen   []   Productive cough   [] Hemoptysis   [] Wheeze  [] COPD   [] Asthma Neurologic:  [] Dizziness   [] Seizures   [] History of stroke   [] History of TIA  [] Aphasia   [] Vissual changes   [] Weakness or numbness in arm   [] Weakness or numbness in leg Musculoskeletal:   [] Joint swelling   [] Joint pain   [] Low back pain Hematologic:  [] Easy bruising  [] Easy bleeding   [] Hypercoagulable state   [] Anemic Gastrointestinal:  [] Diarrhea   [] Vomiting  [x] Gastroesophageal reflux/heartburn   [] Difficulty swallowing. Genitourinary:  [] Chronic kidney disease   [] Difficult urination  [] Frequent urination   [] Blood in urine Skin:  [] Rashes   [] Ulcers  Psychological:  [] History of anxiety   []  History of major depression.  Physical Examination  There were no vitals filed for  this visit. There is no height or weight on file to calculate BMI. Gen: WD/WN, NAD Head: [place]/AT, No temporalis wasting.  Ear/Nose/Throat: Hearing grossly intact, nares w/o erythema or drainage, pinna without lesions Eyes: PER, EOMI, sclera nonicteric.  Neck: Supple, no gross masses.  No JVD.  Pulmonary:  Good air movement, no audible wheezing, no use of accessory muscles.  Cardiac: RRR, precordium not hyperdynamic. Vascular:  scattered varicosities present bilaterally.  Mild venous stasis changes to the legs bilaterally.  3-4+ soft pitting edema  Vessel Right Left  Radial Palpable Palpable  Gastrointestinal: soft, non-distended. No guarding/no peritoneal signs.  Musculoskeletal: M/S 5/5 throughout.  No deformity.  Neurologic: CN 2-12 intact. Pain and light touch intact in extremities.  Symmetrical.  Speech is fluent. Motor exam as listed above. Psychiatric: Judgment intact, Mood & affect appropriate for pt's clinical situation. Dermatologic: Venous rashes no ulcers noted.  No changes consistent with cellulitis. Lymph : No lichenification or skin changes of chronic lymphedema.  CBC Lab Results  Component Value Date   WBC 4.6 06/08/2019   HGB 13.3 06/08/2019   HCT 39.3 06/08/2019   MCV 94 06/08/2019   PLT 200 06/08/2019    BMET    Component Value Date/Time   NA 140 06/08/2019 0714   NA 139 11/22/2013 1353   K 4.1 06/08/2019 0714   K 3.3 (L) 11/22/2013 1353   CL 105 06/08/2019 0714   CL 102 11/22/2013 1353   CO2 21 06/08/2019 0714   CO2 28 11/22/2013 1353   GLUCOSE 95 06/08/2019 0714   GLUCOSE 84 09/28/2018 1010   GLUCOSE 102 (H) 11/22/2013 1353   BUN 17 06/08/2019 0714   BUN 8 11/22/2013 1353   CREATININE 0.66 06/08/2019 0714   CREATININE 0.84 11/22/2013 1353   CALCIUM 9.7 06/08/2019 0714   CALCIUM 8.9 11/22/2013 1353   GFRNONAA 116 06/08/2019 0714   GFRNONAA >60 11/22/2013 1353   GFRAA 133 06/08/2019 0714   GFRAA >60 11/22/2013 1353   CrCl cannot be calculated  (Patient's most recent lab result is older than the maximum 21 days allowed.).  COAG No results found for: "INR", "PROTIME"  Radiology ECHOCARDIOGRAM COMPLETE  Result Date: 07/20/2022    ECHOCARDIOGRAM REPORT   Patient Name:   YAMIRA PAPA Date of Exam: 07/20/2022 Medical Rec #:  [id number]    Height:       65.0 in Accession #:    [id number]   Weight:       220.6 lb Date of Birth:  [birth year]   BSA:          2.062 m Patient Age:    31 years     BP:  118/62 mmHg Patient Gender: F            HR:           71 bpm. Exam Location:  [facility] Procedure: 2D Echo, Cardiac Doppler and Color Doppler Indications:     R07.9 Chest pain  History:         Patient has no prior history of Echocardiogram examinations.  Sonographer:     Ceasar Mons Referring Phys:  [id number] Pasty Spillers MCLEAN-SCOCUZZA Diagnosing Phys: Yvonne Kendall MD IMPRESSIONS  1. Left ventricular ejection fraction, by estimation, is 55 to 60%. The left ventricle has normal function. The left ventricle has no regional wall motion abnormalities. Left ventricular diastolic parameters were normal.  2. Right ventricular systolic function is normal. The right ventricular size is normal.  3. The mitral valve is normal in structure. No evidence of mitral valve regurgitation.  4. The aortic valve is tricuspid. Aortic valve regurgitation is not visualized. No aortic stenosis is present.  5. The inferior vena cava is normal in size with <50% respiratory variability, suggesting right atrial pressure of 8 mmHg. Conclusion(s)/Recommendation(s): Normal biventricular function without evidence of hemodynamically significant valvular heart disease. FINDINGS  Left Ventricle: Left ventricular ejection fraction, by estimation, is 55 to 60%. The left ventricle has normal function. The left ventricle has no regional wall motion abnormalities. The left ventricular internal cavity size was normal in size. There is  no left ventricular hypertrophy. Left ventricular diastolic  parameters were normal. Right Ventricle: The right ventricular size is normal. No increase in right ventricular wall thickness. Right ventricular systolic function is normal. Left Atrium: Left atrial size was normal in size. Right Atrium: Right atrial size was normal in size. Pericardium: There is no evidence of pericardial effusion. Mitral Valve: The mitral valve is normal in structure. No evidence of mitral valve regurgitation. Tricuspid Valve: The tricuspid valve is normal in structure. Tricuspid valve regurgitation is not demonstrated. Aortic Valve: The aortic valve is tricuspid. Aortic valve regurgitation is not visualized. No aortic stenosis is present. Aortic valve mean gradient measures 3.0 mmHg. Aortic valve peak gradient measures 4.8 mmHg. Aortic valve area, by VTI measures 1.87 cm. Pulmonic Valve: The pulmonic valve was normal in structure. Pulmonic valve regurgitation is not visualized. No evidence of pulmonic stenosis. Aorta: The aortic root and ascending aorta are structurally normal, with no evidence of dilitation. Pulmonary Artery: The pulmonary artery is of normal size. Venous: The inferior vena cava is normal in size with less than 50% respiratory variability, suggesting right atrial pressure of 8 mmHg. IAS/Shunts: The interatrial septum was not well visualized.  LEFT VENTRICLE PLAX 2D LVIDd:         4.77 cm   Diastology LVIDs:         3.24 cm   LV e' medial:    10.90 cm/s LV PW:         0.86 cm   LV E/e' medial:  7.4 LV IVS:        0.84 cm   LV e' lateral:   13.70 cm/s LVOT diam:     1.90 cm   LV E/e' lateral: 5.9 LV SV:         44 LV SV Index:   21 LVOT Area:     2.84 cm  RIGHT VENTRICLE RV Basal diam:  3.13 cm RV S prime:     13.60 cm/s TAPSE (M-mode): 2.6 cm LEFT ATRIUM             Index  RIGHT ATRIUM           Index LA diam:        2.50 cm 1.21 cm/m   RA Area:     13.00 cm LA Vol (A2C):   66.7 ml 32.34 ml/m  RA Volume:   29.10 ml  14.11 ml/m LA Vol (A4C):   35.7 ml 17.31 ml/m LA  Biplane Vol: 52.2 ml 25.31 ml/m  AORTIC VALVE AV Area (Vmax):    1.92 cm AV Area (Vmean):   2.01 cm AV Area (VTI):     1.87 cm AV Vmax:           109.00 cm/s AV Vmean:          78.500 cm/s AV VTI:            0.235 m AV Peak Grad:      4.8 mmHg AV Mean Grad:      3.0 mmHg LVOT Vmax:         73.80 cm/s LVOT Vmean:        55.700 cm/s LVOT VTI:          0.155 m LVOT/AV VTI ratio: 0.66  AORTA Ao Root diam: 2.90 cm Ao Asc diam:  2.32 cm MITRAL VALVE MV Area (PHT): 7.09 cm    SHUNTS MV Decel Time: 107 msec    Systemic VTI:  0.16 m MV E velocity: 81.00 cm/s  Systemic Diam: 1.90 cm MV A velocity: 51.00 cm/s MV E/A ratio:  1.59 Cristal Deer End MD Electronically signed by Yvonne Kendall MD Signature Date/Time: 07/20/2022/5:49:11 PM    Final      Assessment/Plan 1. Lymphedema Recommend:  I have had a long discussion with the patient regarding swelling and why it  causes symptoms.  Patient will begin wearing graduated compression on a daily basis a prescription was given. The patient will  wear the stockings first thing in the morning and removing them in the evening. The patient is instructed specifically not to sleep in the stockings.   In addition, behavioral modification will be initiated.  This will include frequent elevation, use of over the counter pain medications and exercise such as walking.  Consideration for a lymph pump will also be made based upon the effectiveness of conservative therapy.  This would help to improve the edema control and prevent sequela such as ulcers and infections   Duplex ultrasound is negative for DVT no superficial reflux identified  The patient will follow-up with me in three months.    2. Gastroesophageal reflux disease, unspecified whether esophagitis present Continue PPI as already ordered, this medication has been reviewed and there are no changes at this time.  Avoidence of caffeine and alcohol  Moderate elevation of the head of the bed      Levora Dredge, MD  08/13/2022 6:00 PM

## 2022-08-16 ENCOUNTER — Ambulatory Visit (INDEPENDENT_AMBULATORY_CARE_PROVIDER_SITE_OTHER): Payer: BC Managed Care – PPO | Admitting: Vascular Surgery

## 2022-08-16 ENCOUNTER — Encounter (INDEPENDENT_AMBULATORY_CARE_PROVIDER_SITE_OTHER): Payer: Self-pay | Admitting: Vascular Surgery

## 2022-08-16 VITALS — BP 114/78 | HR 81 | Resp 16 | Wt 220.0 lb

## 2022-08-16 DIAGNOSIS — K219 Gastro-esophageal reflux disease without esophagitis: Secondary | ICD-10-CM

## 2022-08-16 DIAGNOSIS — N921 Excessive and frequent menstruation with irregular cycle: Secondary | ICD-10-CM | POA: Diagnosis not present

## 2022-08-16 DIAGNOSIS — Z975 Presence of (intrauterine) contraceptive device: Secondary | ICD-10-CM | POA: Diagnosis not present

## 2022-08-16 DIAGNOSIS — I890 Lymphedema, not elsewhere classified: Secondary | ICD-10-CM | POA: Diagnosis not present

## 2022-08-20 ENCOUNTER — Telehealth: Payer: Self-pay

## 2022-08-20 NOTE — Telephone Encounter (Signed)
PA was started today through:  [email or web address]   Prior Auth (EOC) ID: [id number] Drug/Service Name: PHENTERMINE 37.5 MG TABLET Patient: Emily Griffin Date Requested: 08/20/2022 3:46:15 PM   MemberID: [id number] DOB: 06-20-1986   Awaiting approval or denial

## 2022-08-22 ENCOUNTER — Encounter (INDEPENDENT_AMBULATORY_CARE_PROVIDER_SITE_OTHER): Payer: Self-pay | Admitting: Vascular Surgery

## 2022-09-10 NOTE — Telephone Encounter (Signed)
PA has been Approved on 08/23/22 for pt's PHENTERMINE 37.5 MG TABLET via [email or web address]   EOC [id number]

## 2022-09-13 ENCOUNTER — Encounter (INDEPENDENT_AMBULATORY_CARE_PROVIDER_SITE_OTHER): Payer: Self-pay

## 2022-10-25 IMAGING — US US EXTREM LOW VENOUS*R*
1 series · 13 of 24 positions shown · non-contrast
Comparison: None.

CLINICAL DATA: Calf pain and edema

EXAM:
RIGHT LOWER EXTREMITY VENOUS DUPLEX ULTRASOUND
TECHNIQUE: Gray-scale sonography with graded compression, as well as color
Doppler and duplex ultrasound were performed to evaluate the right
lower extremity deep venous system from the level of the common
femoral vein and including the common femoral, femoral, profunda
femoral, popliteal and calf veins including the posterior tibial,
peroneal and gastrocnemius veins when visible. The superficial great
saphenous vein was also interrogated. Spectral Doppler was utilized
to evaluate flow at rest and with distal augmentation maneuvers in
the common femoral, femoral and popliteal veins.

[Series 1: us venous img lower uni right (dvt) · portal-venous · 13 of 36 slices shown]
[im 1/36]
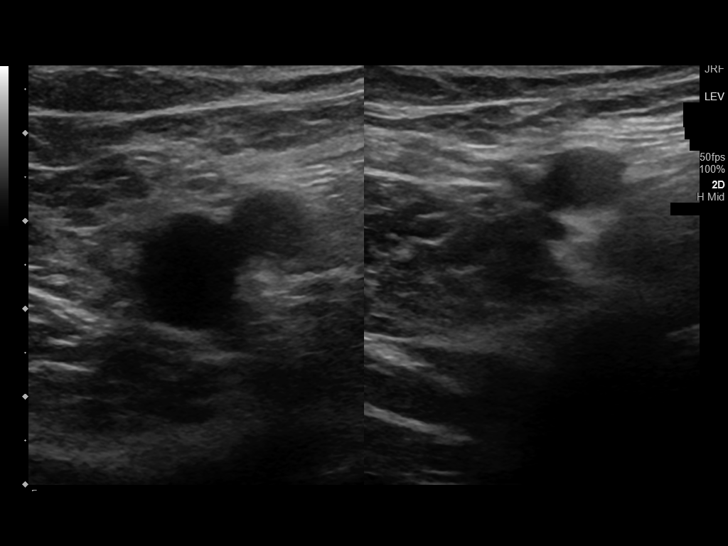
[im 4/36]
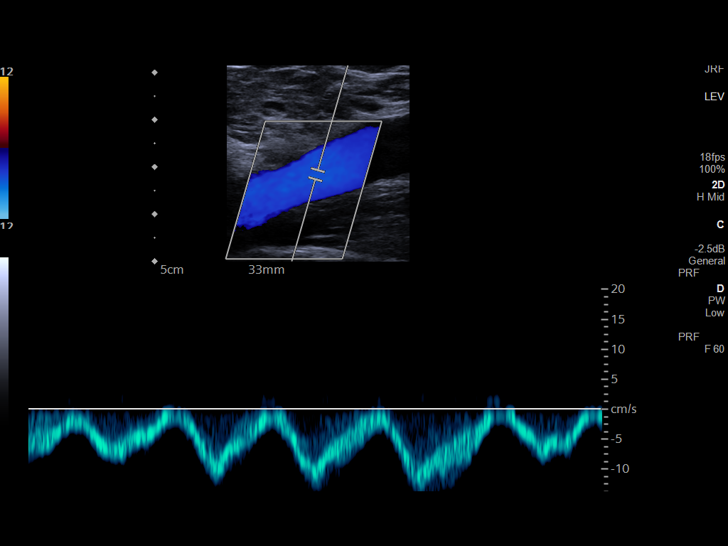
[im 7/36]
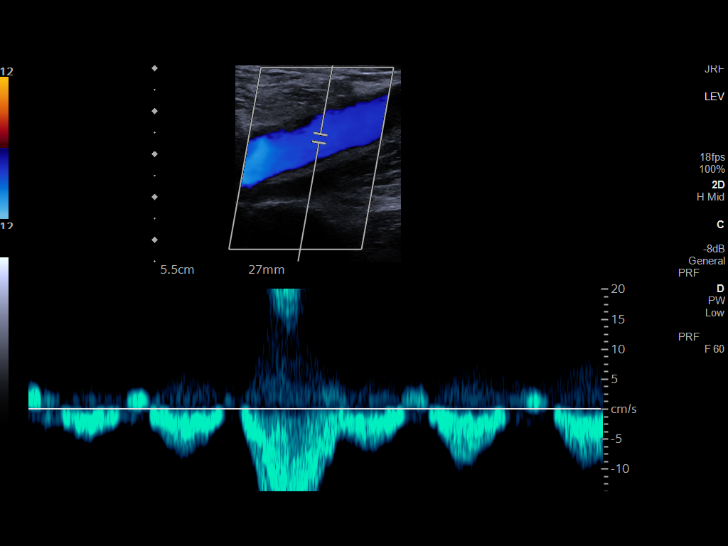
[im 10/36]
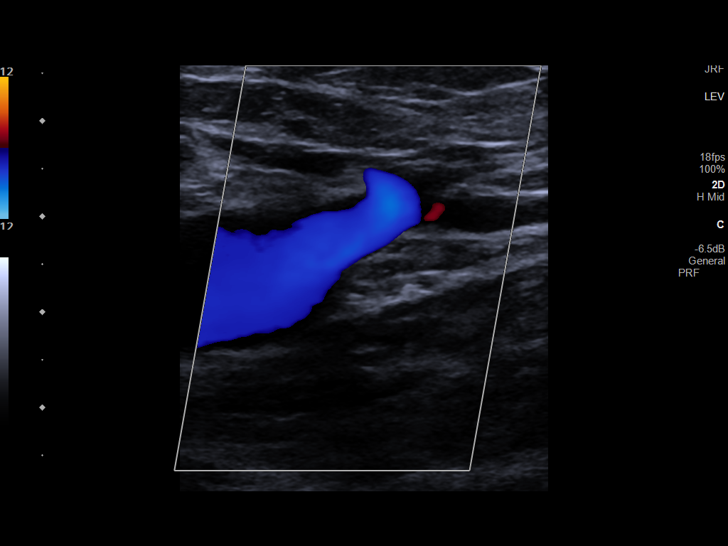
[im 13/36]
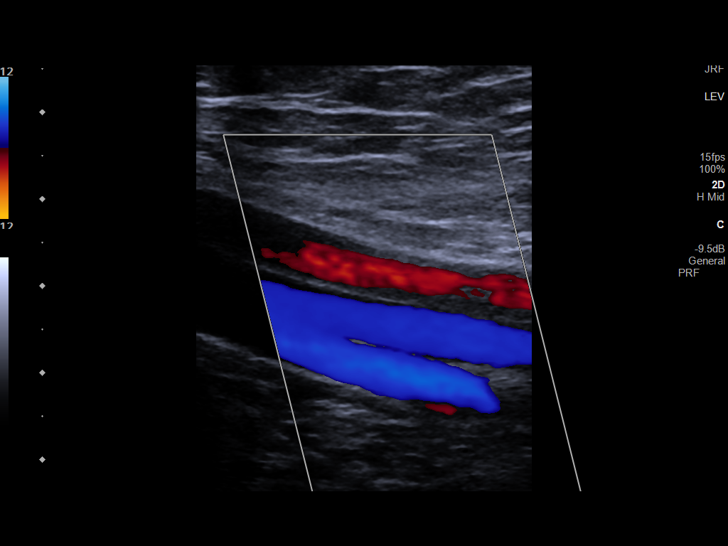
[im 16/36]
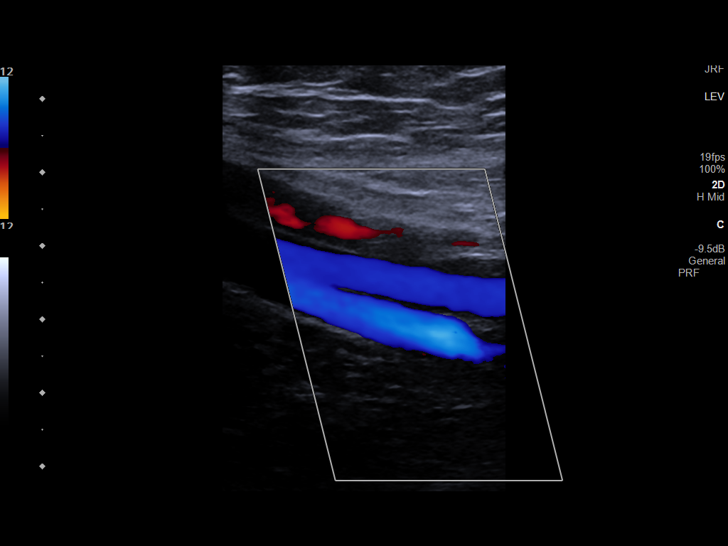
[im 19/36]
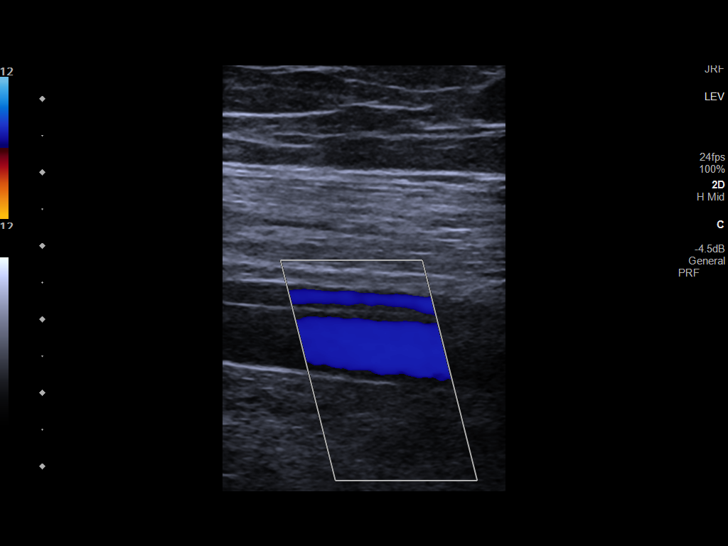
[im 20/36]
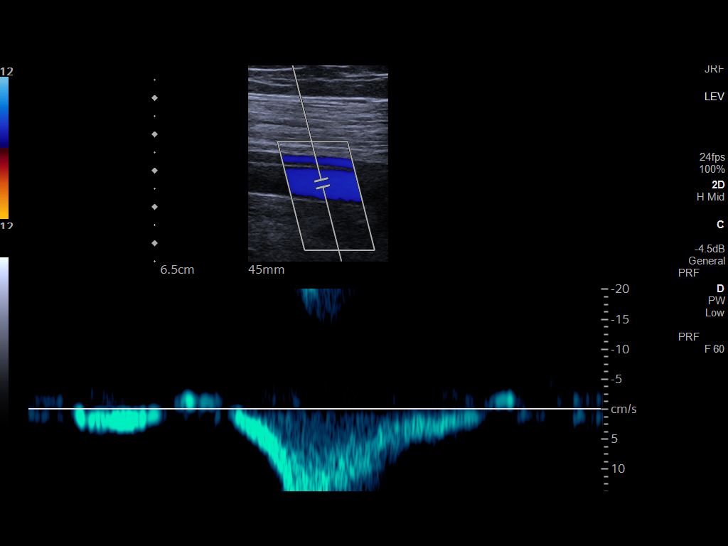
[im 23/36]
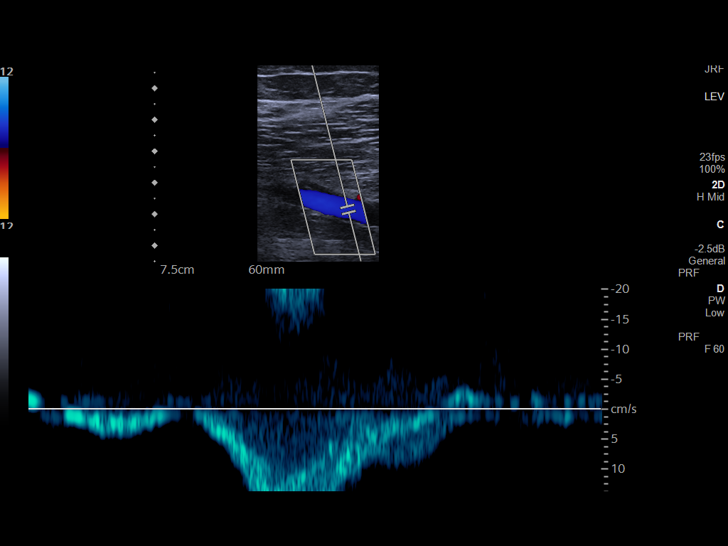
[im 26/36]
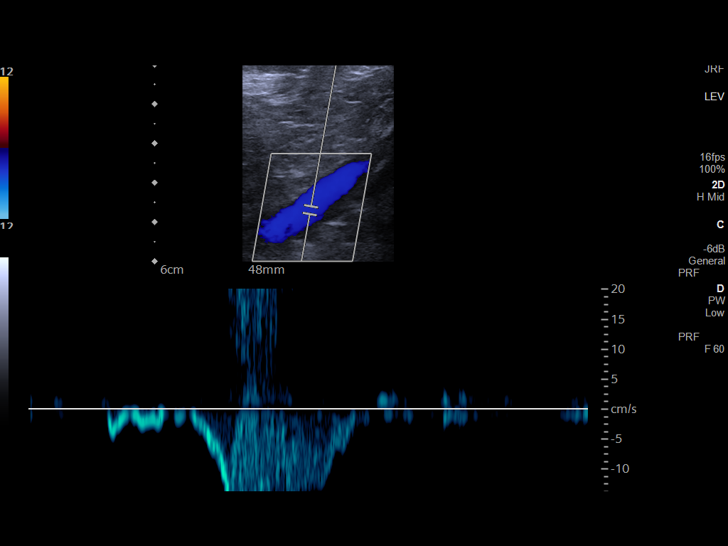
[im 29/36]
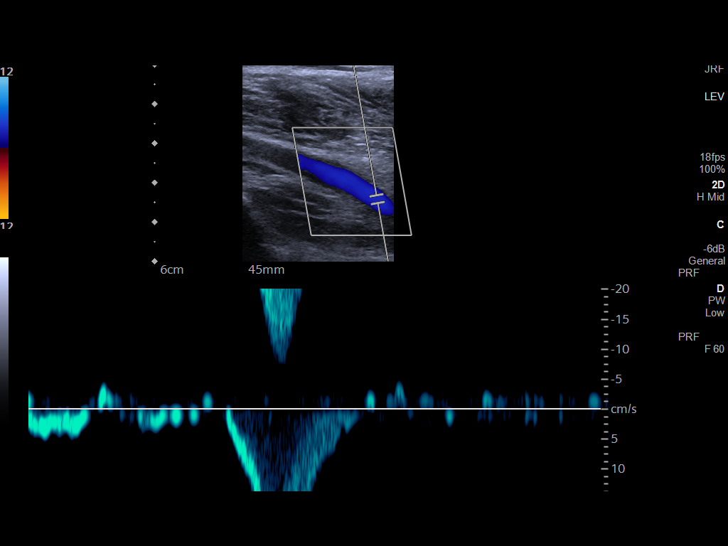
[im 32/36]
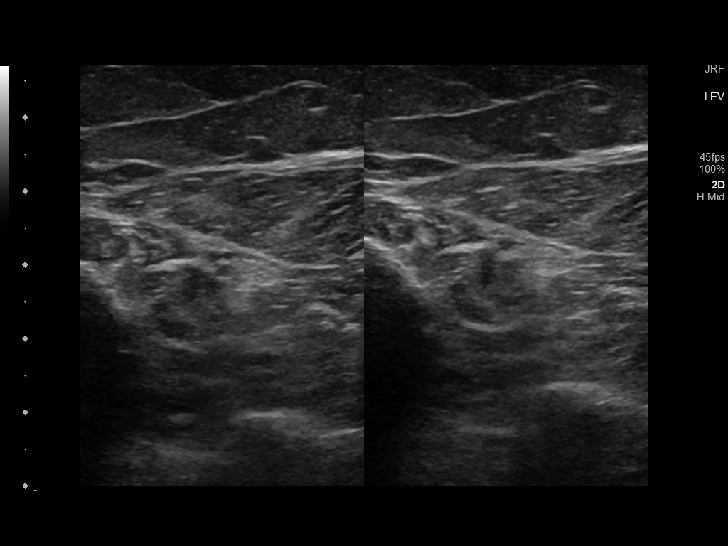
[im 36/36]
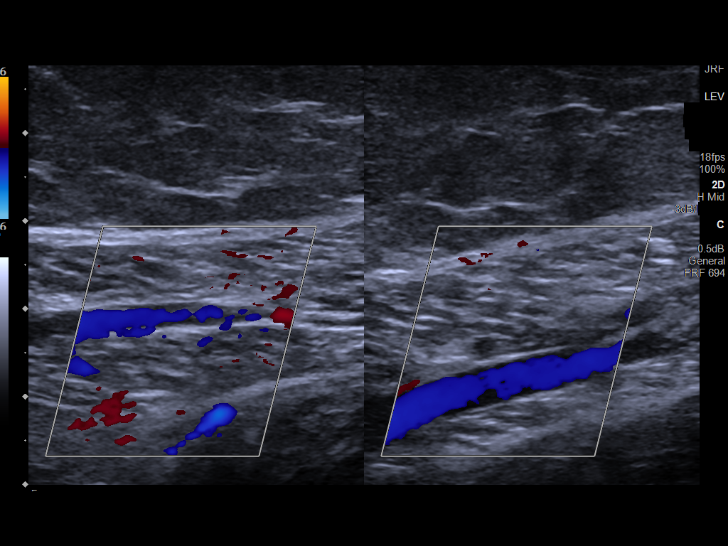

[13 of 24 positions shown; findings below may reference images not displayed]

FINDINGS: Contralateral Common Femoral Vein: Respiratory phasicity is normal
and symmetric with the symptomatic side. No evidence of thrombus.
Normal compressibility.

Common Femoral Vein: No evidence of thrombus. Normal
compressibility, respiratory phasicity and response to augmentation.

Saphenofemoral Junction: No evidence of thrombus. Normal
compressibility and flow on color Doppler imaging.

Profunda Femoral Vein: No evidence of thrombus. Normal
compressibility and flow on color Doppler imaging.

Femoral Vein: No evidence of thrombus. Normal compressibility,
respiratory phasicity and response to augmentation.

Popliteal Vein: No evidence of thrombus. Normal compressibility,
respiratory phasicity and response to augmentation.

Calf Veins: No evidence of thrombus. Normal compressibility and flow
on color Doppler imaging.

Superficial Great Saphenous Vein: No evidence of thrombus. Normal
compressibility.

Venous Reflux:  None.

Other Findings:  None.
IMPRESSION: No evidence of deep venous thrombosis in the right lower extremity.

Left common femoral vein also patent.

## 2022-10-26 LAB — CBC AND DIFFERENTIAL
HCT: 41 (ref 36–46)
Hemoglobin: 14.3 (ref 12.0–16.0)
Neutrophils Absolute: 2.1
Platelets: 239 10*3/uL (ref 150–400)
WBC: 4

## 2022-10-26 LAB — COMPREHENSIVE METABOLIC PANEL
Calcium: 9.6 (ref 8.7–10.7)
eGFR: 84

## 2022-10-26 LAB — BASIC METABOLIC PANEL
BUN: 9 (ref 4–21)
Chloride: 103 (ref 99–108)
Creatinine: 0.9 (ref 0.5–1.1)
Glucose: 93
Potassium: 4.3 mEq/L (ref 3.5–5.1)
Sodium: 140 (ref 137–147)

## 2022-10-26 LAB — CBC: RBC: 4.53 (ref 3.87–5.11)

## 2022-10-26 LAB — IRON,TIBC AND FERRITIN PANEL: Iron: 92

## 2022-10-26 LAB — HEPATIC FUNCTION PANEL
ALT: 13 U/L (ref 7–35)
AST: 14 (ref 13–35)
Alkaline Phosphatase: 85 (ref 25–125)

## 2022-10-27 ENCOUNTER — Other Ambulatory Visit: Payer: Self-pay

## 2022-10-27 LAB — LIPID PANEL
Cholesterol: 178 (ref 0–200)
HDL: 51 (ref 35–70)
LDL Cholesterol: 116
LDl/HDL Ratio: 3.5
Triglycerides: 59 (ref 40–160)

## 2022-10-28 DIAGNOSIS — R051 Acute cough: Secondary | ICD-10-CM | POA: Diagnosis not present

## 2022-11-03 ENCOUNTER — Other Ambulatory Visit (INDEPENDENT_AMBULATORY_CARE_PROVIDER_SITE_OTHER): Payer: Self-pay | Admitting: Vascular Surgery

## 2022-11-03 DIAGNOSIS — K219 Gastro-esophageal reflux disease without esophagitis: Secondary | ICD-10-CM

## 2022-11-03 DIAGNOSIS — I890 Lymphedema, not elsewhere classified: Secondary | ICD-10-CM

## 2022-11-03 DIAGNOSIS — Z8672 Personal history of thrombophlebitis: Secondary | ICD-10-CM

## 2022-11-03 NOTE — Progress Notes (Unsigned)
MRN : [id number]  Emily Griffin is a 36 y.o. (12-Jun-1986) female who presents with chief complaint of legs swell.  History of Present Illness:   The patient returns to the office for followup evaluation regarding leg swelling.  The swelling has persisted and the pain associated with swelling continues. There have not been any interval development of a ulcerations or wounds.  Since the previous visit the patient has been wearing graduated compression stockings and has noted little if any improvement in the lymphedema. The patient has been using compression routinely morning until night.  The patient also states elevation during the day and exercise is being done too.  No outpatient medications have been marked as taking for the 11/03/22 encounter (Orders Only) with Dequincy Born, Latina Craver, MD.    Past Medical History:  Diagnosis Date   Abnormal Pap smear of cervix    COVID-19    10/2020, 04/2022 end   DVT (deep venous thrombosis) (HCC) 2014   right leg was on OCP   Migraine    in college    STD (female)    HSV, HPV    Past Surgical History:  Procedure Laterality Date   CERVICAL BIOPSY  W/ LOOP ELECTRODE EXCISION  2009    Social History Social History   Tobacco Use   Smoking status: Never   Smokeless tobacco: Never  Vaping Use   Vaping Use: Never used  Substance Use Topics   Alcohol use: No    Alcohol/week: 0.0 standard drinks of alcohol   Drug use: No    Family History Family History  Problem Relation Age of Onset   Miscarriages / Stillbirths Mother    Edema Mother    Hypertension Father    Asthma Brother    Edema Brother        lymphedema   Peripheral Artery Disease Maternal Grandmother        with stent   Alcohol abuse Maternal Grandfather    Cancer Maternal Grandfather        lung cancer mets smoker   Alcohol abuse Paternal Grandfather    Cancer Paternal Grandfather        pancreatitc    Hypertension Paternal Grandfather     Allergies  Allergen  Reactions   Amoxicillin Rash     REVIEW OF SYSTEMS (Negative unless checked)  Constitutional: [] Weight loss  [] Fever  [] Chills Cardiac: [] Chest pain   [] Chest pressure   [] Palpitations   [] Shortness of breath when laying flat   [] Shortness of breath with exertion. Vascular:  [] Pain in legs with walking   [x] Pain in legs with standing  [x] History of DVT   [] Phlebitis   [x] Swelling in legs   [] Varicose veins   [] Non-healing ulcers Pulmonary:   [] Uses home oxygen   [] Productive cough   [] Hemoptysis   [] Wheeze  [] COPD   [] Asthma Neurologic:  [] Dizziness   [] Seizures   [] History of stroke   [] History of TIA  [] Aphasia   [] Vissual changes   [] Weakness or numbness in arm   [] Weakness or numbness in leg Musculoskeletal:   [] Joint swelling   [] Joint pain   [] Low back pain Hematologic:  [] Easy bruising  [] Easy bleeding   [] Hypercoagulable state   [] Anemic Gastrointestinal:  [] Diarrhea   [] Vomiting  [x] Gastroesophageal reflux/heartburn   [] Difficulty swallowing. Genitourinary:  [] Chronic kidney disease   [] Difficult urination  [] Frequent urination   [] Blood in urine Skin:  [] Rashes   [] Ulcers  Psychological:  [] History of anxiety   []   History of major depression.  Physical Examination  There were no vitals filed for this visit. There is no height or weight on file to calculate BMI. Gen: WD/WN, NAD Head: [place]/AT, No temporalis wasting.  Ear/Nose/Throat: Hearing grossly intact, nares w/o erythema or drainage, pinna without lesions Eyes: PER, EOMI, sclera nonicteric.  Neck: Supple, no gross masses.  No JVD.  Pulmonary:  Good air movement, no audible wheezing, no use of accessory muscles.  Cardiac: RRR, precordium not hyperdynamic. Vascular:  scattered varicosities present bilaterally.  Mild venous stasis changes to the legs bilaterally.  3-4+ soft pitting edema, CEAP [id number]  Vessel Right Left  Radial Palpable Palpable  Gastrointestinal: soft, non-distended. No guarding/no peritoneal signs.   Musculoskeletal: M/S 5/5 throughout.  No deformity.  Neurologic: CN 2-12 intact. Pain and light touch intact in extremities.  Symmetrical.  Speech is fluent. Motor exam as listed above. Psychiatric: Judgment intact, Mood & affect appropriate for pt's clinical situation. Dermatologic: Venous rashes no ulcers noted.  No changes consistent with cellulitis. Lymph : No lichenification or skin changes of chronic lymphedema.  CBC Lab Results  Component Value Date   WBC 4.0 10/26/2022   HGB 14.3 10/26/2022   HCT 41 10/26/2022   MCV 94 06/08/2019   PLT 239 10/26/2022    BMET    Component Value Date/Time   NA 140 10/26/2022 0000   NA 139 11/22/2013 1353   K 4.3 10/26/2022 0000   K 3.3 (L) 11/22/2013 1353   CL 103 10/26/2022 0000   CL 102 11/22/2013 1353   CO2 21 06/08/2019 0714   CO2 28 11/22/2013 1353   GLUCOSE 95 06/08/2019 0714   GLUCOSE 84 09/28/2018 1010   GLUCOSE 102 (H) 11/22/2013 1353   BUN 9 10/26/2022 0000   BUN 8 11/22/2013 1353   CREATININE 0.9 10/26/2022 0000   CREATININE 0.66 06/08/2019 0714   CREATININE 0.84 11/22/2013 1353   CALCIUM 9.6 10/26/2022 0000   CALCIUM 8.9 11/22/2013 1353   GFRNONAA 116 06/08/2019 0714   GFRNONAA >60 11/22/2013 1353   GFRAA 133 06/08/2019 0714   GFRAA >60 11/22/2013 1353   Estimated Creatinine Clearance: 99.1 mL/min (by C-G formula based on SCr of 0.9 mg/dL).  COAG No results found for: "INR", "PROTIME"  Radiology No results found.   Assessment/Plan There are no diagnoses linked to this encounter.   Levora Dredge, MD  11/03/2022 1:06 PM

## 2022-11-09 ENCOUNTER — Encounter: Payer: Self-pay | Admitting: Family Medicine

## 2022-11-09 ENCOUNTER — Ambulatory Visit: Payer: BC Managed Care – PPO | Admitting: Family Medicine

## 2022-11-09 VITALS — BP 114/68 | HR 79 | Temp 98.2°F | Ht 65.0 in | Wt 216.8 lb

## 2022-11-09 DIAGNOSIS — K219 Gastro-esophageal reflux disease without esophagitis: Secondary | ICD-10-CM

## 2022-11-09 DIAGNOSIS — I890 Lymphedema, not elsewhere classified: Secondary | ICD-10-CM

## 2022-11-09 DIAGNOSIS — Z23 Encounter for immunization: Secondary | ICD-10-CM | POA: Diagnosis not present

## 2022-11-09 DIAGNOSIS — E669 Obesity, unspecified: Secondary | ICD-10-CM | POA: Diagnosis not present

## 2022-11-09 NOTE — Patient Instructions (Addendum)
It was a pleasure meeting you today. Thank you for allowing me to take part in your health care.  Our goals for today as we discussed include:  Please call your insurance to see if the injectable Zepbound (Tirzepatide)  for weight loss is covered  Other medication is Qsymia or combination Lomaira (Phentermine) /Topamax (Topiramate)  Once you find out you can MyChart me and will send in prescription.  Your received your Tetanus booster today.  Follow up with OBGYN for PAP as scheduled.  If you have any questions or concerns, please do not hesitate to call the office at [phone].  I look forward to our next visit and until then take care and stay safe.  Regards,   Dana Allan, MD   [facility]

## 2022-11-09 NOTE — Progress Notes (Signed)
MRN : [id number]  Emily Griffin is a 36 y.o. ([birth year]) female who presents with chief complaint of legs swell.  History of Present Illness:   The patient returns to the office for followup evaluation regarding leg swelling.  Since the previous visit the patient has been wearing [employer] compression socks and has found them to be very comfortable.  The swelling has improved quite a bit and the pain associated with swelling has decreased substantially. There have not been any interval development of a ulcerations or wounds.  Since the previous visit the patient has been wearing graduated compression stockings and has noted some improvement in the lymphedema. The patient has been using compression routinely morning until night.  The patient also states elevation during the day and exercise (such as walking) is being done too.  Venous duplex dated 07/09/2022 demonstrated essentially normal venous system   No outpatient medications have been marked as taking for the 11/18/22 encounter (Appointment) with Delana Meyer, Dolores Lory, MD.    Past Medical History:  Diagnosis Date   Abnormal Pap smear of cervix    COVID-19    10/2020, 04/2022 end   DVT (deep venous thrombosis) ([place]) 2014   right leg was on OCP   Migraine    in college    STD (female)    HSV, HPV    Past Surgical History:  Procedure Laterality Date   CERVICAL BIOPSY  W/ LOOP ELECTRODE EXCISION  2009    Social History Social History   Tobacco Use   Smoking status: Never   Smokeless tobacco: Never  Vaping Use   Vaping Use: Never used  Substance Use Topics   Alcohol use: No    Alcohol/week: 0.0 standard drinks of alcohol   Drug use: No    Family History Family History  Problem Relation Age of Onset   Miscarriages / Stillbirths Mother    Edema Mother    Hypertension Father    Asthma Brother    Edema Brother        lymphedema   Peripheral Artery Disease Maternal Grandmother        with stent   Alcohol abuse  Maternal Grandfather    Cancer Maternal Grandfather        lung cancer mets smoker   Alcohol abuse Paternal Grandfather    Cancer Paternal Grandfather        pancreatitc    Hypertension Paternal Grandfather     Allergies  Allergen Reactions   Amoxicillin Rash     REVIEW OF SYSTEMS (Negative unless checked)  Constitutional: [] Weight loss  [] Fever  [] Chills Cardiac: [] Chest pain   [] Chest pressure   [] Palpitations   [] Shortness of breath when laying flat   [] Shortness of breath with exertion. Vascular:  [] Pain in legs with walking   [x] Pain in legs with standing  [] History of DVT   [] Phlebitis   [x] Swelling in legs   [] Varicose veins   [] Non-healing ulcers Pulmonary:   [] Uses home oxygen   [] Productive cough   [] Hemoptysis   [] Wheeze  [] COPD   [] Asthma Neurologic:  [] Dizziness   [] Seizures   [] History of stroke   [] History of TIA  [] Aphasia   [] Vissual changes   [] Weakness or numbness in arm   [] Weakness or numbness in leg Musculoskeletal:   [] Joint swelling   [] Joint pain   [] Low back pain Hematologic:  [] Easy bruising  [] Easy bleeding   [] Hypercoagulable state   [] Anemic Gastrointestinal:  [] Diarrhea   [] Vomiting  [x] Gastroesophageal  reflux/heartburn   [] Difficulty swallowing. Genitourinary:  [] Chronic kidney disease   [] Difficult urination  [] Frequent urination   [] Blood in urine Skin:  [] Rashes   [] Ulcers  Psychological:  [] History of anxiety   []  History of major depression.  Physical Examination  There were no vitals filed for this visit. There is no height or weight on file to calculate BMI. Gen: WD/WN, NAD Head: [place]/AT, No temporalis wasting.  Ear/Nose/Throat: Hearing grossly intact, nares w/o erythema or drainage, pinna without lesions Eyes: PER, EOMI, sclera nonicteric.  Neck: Supple, no gross masses.  No JVD.  Pulmonary:  Good air movement, no audible wheezing, no use of accessory muscles.  Cardiac: RRR, precordium not hyperdynamic. Vascular:  scattered varicosities  present bilaterally.  Mild venous stasis changes to the legs bilaterally. 1-2+ soft pitting edema left is more affected than right, CEAP [id number]  Vessel Right Left  Radial Palpable Palpable  Gastrointestinal: soft, non-distended. No guarding/no peritoneal signs.  Musculoskeletal: M/S 5/5 throughout.  No deformity.  Neurologic: CN 2-12 intact. Pain and light touch intact in extremities.  Symmetrical.  Speech is fluent. Motor exam as listed above. Psychiatric: Judgment intact, Mood & affect appropriate for pt's clinical situation. Dermatologic: Venous rashes no ulcers noted.  No changes consistent with cellulitis. Lymph : No lichenification or skin changes of chronic lymphedema.  CBC Lab Results  Component Value Date   WBC 4.0 10/26/2022   HGB 14.3 10/26/2022   HCT 41 10/26/2022   MCV 94 06/08/2019   PLT 239 10/26/2022    BMET    Component Value Date/Time   NA 140 10/26/2022 0000   NA 139 11/22/2013 1353   K 4.3 10/26/2022 0000   K 3.3 (L) 11/22/2013 1353   CL 103 10/26/2022 0000   CL 102 11/22/2013 1353   CO2 21 06/08/2019 0714   CO2 28 11/22/2013 1353   GLUCOSE 95 06/08/2019 0714   GLUCOSE 84 09/28/2018 1010   GLUCOSE 102 (H) 11/22/2013 1353   BUN 9 10/26/2022 0000   BUN 8 11/22/2013 1353   CREATININE 0.9 10/26/2022 0000   CREATININE 0.66 06/08/2019 0714   CREATININE 0.84 11/22/2013 1353   CALCIUM 9.6 10/26/2022 0000   CALCIUM 8.9 11/22/2013 1353   GFRNONAA 116 06/08/2019 0714   GFRNONAA >60 11/22/2013 1353   GFRAA 133 06/08/2019 0714   GFRAA >60 11/22/2013 1353   Estimated Creatinine Clearance: 98.3 mL/min (by C-G formula based on SCr of 0.9 mg/dL).  COAG No results found for: "INR", "PROTIME"  Radiology No results found.   Assessment/Plan 1. Lymphedema Recommend:  No surgery or intervention at this point in time.    I have reviewed my discussion with the patient regarding venous insufficiency and secondary lymph edema and why it  causes symptoms. I  have discussed with the patient the chronic skin changes that accompany these problems and the long term sequela such as ulceration and infection.  Patient will continue wearing graduated compression on a daily basis a prescription, if needed, was given to the patient to keep this updated. The patient will  put the compression on first thing in the morning and removing them in the evening. The patient is instructed specifically not to sleep in the compression.  In addition, behavioral modification including elevation during the day will be continued.  Diet and salt restriction will also be helpful.  Previous duplex ultrasound of the lower extremities shows normal deep venous system.   At the present time the patient is very happy with level  of control that she is receiving from conservative therapies including compression therapy, elevation and exercise.  She does not wish to move forward with the pump at this time and I agree with this decision.  Patient will follow-up on an as-needed basis we discussed that a lymphedema pump could always be added down the road if her lymphedema worsened significantly.   2. History of deep vein thrombophlebitis of lower extremity The patient appears to have mild symptoms with of postphlebitic syndrome.  Plan is as noted in #1  3. Gastroesophageal reflux disease, unspecified whether esophagitis present Continue PPI as already ordered, this medication has been reviewed and there are no changes at this time.  Avoidence of caffeine and alcohol  Moderate elevation of the head of the bed     Hortencia Pilar, MD  11/09/2022 3:49 PM

## 2022-11-09 NOTE — Progress Notes (Unsigned)
   SUBJECTIVE:   Chief Complaint  Patient presents with  . Establish Care    Transfer of Care   HPI ***  PERTINENT PMH / PSH: ***  OBJECTIVE:  BP 114/68   Pulse 79   Temp 98.2 F (36.8 C) (Oral)   Ht 5\' 5"  (1.651 m)   Wt 216 lb 12.8 oz (98.3 kg)   SpO2 98%   BMI 36.08 kg/m    Physical Exam Vitals reviewed.  Constitutional:      General: She is not in acute distress.    Appearance: She is not ill-appearing.  HENT:     Head: Normocephalic.     Nose: Nose normal.  Eyes:     Conjunctiva/sclera: Conjunctivae normal.  Neck:     Thyroid: No thyroid mass or thyromegaly.  Cardiovascular:     Rate and Rhythm: Normal rate and regular rhythm.     Heart sounds: Normal heart sounds.  Pulmonary:     Effort: Pulmonary effort is normal.     Breath sounds: Normal breath sounds.  Abdominal:     General: Abdomen is flat. Bowel sounds are normal.     Palpations: Abdomen is soft.  Musculoskeletal:        General: Normal range of motion.     Cervical back: Normal range of motion and neck supple.  Lymphadenopathy:     Cervical: No cervical adenopathy.  Neurological:     Mental Status: She is alert and oriented to person, place, and time. Mental status is at baseline.  Psychiatric:        Mood and Affect: Mood normal.        Behavior: Behavior normal.        Thought Content: Thought content normal.        Judgment: Judgment normal.    ASSESSMENT/PLAN:  There are no diagnoses linked to this encounter. PDMP reviewed***  No follow-ups on file.  , MD

## 2022-11-11 ENCOUNTER — Encounter: Payer: Self-pay | Admitting: Family Medicine

## 2022-11-16 ENCOUNTER — Other Ambulatory Visit: Payer: Self-pay

## 2022-11-16 MED ORDER — ZEPBOUND 2.5 MG/0.5ML ~~LOC~~ SOAJ
2.5000 mg | SUBCUTANEOUS | 0 refills | Status: DC
Start: 2022-11-16 — End: 2022-12-15

## 2022-11-16 NOTE — Telephone Encounter (Signed)
Zepbound 2.5 mg x 4 weeks sent to [facility]

## 2022-11-18 ENCOUNTER — Ambulatory Visit (INDEPENDENT_AMBULATORY_CARE_PROVIDER_SITE_OTHER): Payer: BC Managed Care – PPO | Admitting: Vascular Surgery

## 2022-11-18 ENCOUNTER — Encounter (INDEPENDENT_AMBULATORY_CARE_PROVIDER_SITE_OTHER): Payer: Self-pay | Admitting: Vascular Surgery

## 2022-11-18 VITALS — BP 112/76 | HR 89 | Resp 16 | Ht 65.0 in | Wt 218.0 lb

## 2022-11-18 DIAGNOSIS — I890 Lymphedema, not elsewhere classified: Secondary | ICD-10-CM

## 2022-11-18 DIAGNOSIS — Z8672 Personal history of thrombophlebitis: Secondary | ICD-10-CM | POA: Diagnosis not present

## 2022-11-18 DIAGNOSIS — K219 Gastro-esophageal reflux disease without esophagitis: Secondary | ICD-10-CM

## 2022-11-19 ENCOUNTER — Encounter (INDEPENDENT_AMBULATORY_CARE_PROVIDER_SITE_OTHER): Payer: Self-pay | Admitting: Vascular Surgery

## 2022-11-20 ENCOUNTER — Encounter: Payer: Self-pay | Admitting: Family Medicine

## 2022-11-21 ENCOUNTER — Encounter: Payer: Self-pay | Admitting: Family Medicine

## 2022-11-21 DIAGNOSIS — Z23 Encounter for immunization: Secondary | ICD-10-CM | POA: Insufficient documentation

## 2022-11-21 NOTE — Assessment & Plan Note (Signed)
Chronic.  Stable.  Compression stockings and elevation have helped with decreasing swelling. Will discontinue Lasix given no benefit in lymphedema patients Recommend increase protein intake Recommend increasing activity Discussed weight management Continue compression stockings daily.  Off at night Elevate legs frequently Limit sitting position at work. Continue to follow with vascular surgery.

## 2022-11-21 NOTE — Assessment & Plan Note (Signed)
Elevated BMI greater than 35. Interested in weight loss management and injectable medications. Previously tried phentermine, unsuccessful in maintaining weight loss Wegovy not covered under insurance No history of medullary thyroid cancer or pancreatitis Long discussion with patient to set long-term goals regarding nutrition and exercise.  Discussed that medication can help but ultimately to become successful will need to have successful lifestyle balance Patient to call insurance for coverage of new injectable medication. Plan to start Zepbound 2.5 mg weekly x 4 weeks, titrating to most effective dose with continuous follow-up Recommend 3 meals a daily, increasing protein with each meal, increasing water to 64 ounces daily, limit salt intake

## 2022-11-21 NOTE — Assessment & Plan Note (Signed)
Chronic.  Stable on current medication. Continue omeprazole 40 mg daily.  Recommend using sparingly. If needing long-term treatment, consider GI consult for EGD.

## 2022-11-23 ENCOUNTER — Other Ambulatory Visit (HOSPITAL_COMMUNITY): Payer: Self-pay

## 2022-11-23 ENCOUNTER — Telehealth: Payer: Self-pay

## 2022-11-23 NOTE — Telephone Encounter (Signed)
Patient Advocate Encounter   Received notification from [facility] that prior authorization for zepbound 2.5mg /0.84ml is required.   PA submitted on 01/09/202 Status is pending

## 2022-12-02 NOTE — Telephone Encounter (Signed)
Faxed additional info document to [phone]

## 2022-12-02 NOTE — Telephone Encounter (Signed)
Pharmacy Patient Advocate Encounter  Prior Authorization for Zepbound 2.5MG /0.5ML has been approved.    PA# [id number] Effective dates: 12/02/2022 through 08/03/2023

## 2022-12-07 NOTE — Telephone Encounter (Signed)
PA needed for zepbound

## 2022-12-10 ENCOUNTER — Other Ambulatory Visit (HOSPITAL_COMMUNITY): Payer: Self-pay

## 2022-12-10 NOTE — Telephone Encounter (Signed)
Disregard letter, Pharmacy might have tried to run through higher quantity than what PA has been approved for. Her PA should be good until 08/03/2023
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
## 2022-12-14 ENCOUNTER — Encounter: Payer: Self-pay | Admitting: Obstetrics and Gynecology

## 2022-12-14 ENCOUNTER — Ambulatory Visit (INDEPENDENT_AMBULATORY_CARE_PROVIDER_SITE_OTHER): Payer: BC Managed Care – PPO | Admitting: Obstetrics and Gynecology

## 2022-12-14 VITALS — BP 116/80 | HR 78 | Resp 16 | Ht 65.0 in | Wt 222.4 lb

## 2022-12-14 DIAGNOSIS — Z975 Presence of (intrauterine) contraceptive device: Secondary | ICD-10-CM | POA: Diagnosis not present

## 2022-12-14 DIAGNOSIS — N921 Excessive and frequent menstruation with irregular cycle: Secondary | ICD-10-CM | POA: Diagnosis not present

## 2022-12-14 DIAGNOSIS — E669 Obesity, unspecified: Secondary | ICD-10-CM

## 2022-12-14 DIAGNOSIS — Z01419 Encounter for gynecological examination (general) (routine) without abnormal findings: Secondary | ICD-10-CM | POA: Diagnosis not present

## 2022-12-14 NOTE — Patient Instructions (Addendum)

## 2022-12-15 ENCOUNTER — Other Ambulatory Visit: Payer: Self-pay

## 2022-12-15 MED ORDER — ZEPBOUND 2.5 MG/0.5ML ~~LOC~~ SOAJ: 2.5000 mg | SUBCUTANEOUS | 0 refills | Status: DC

## 2022-12-28 ENCOUNTER — Other Ambulatory Visit: Payer: Self-pay

## 2022-12-28 MED ORDER — ZEPBOUND 2.5 MG/0.5ML ~~LOC~~ SOAJ: 2.5000 mg | SUBCUTANEOUS | 3 refills | Status: DC

## 2023-02-10 ENCOUNTER — Ambulatory Visit: Payer: BC Managed Care – PPO | Admitting: Family Medicine

## 2023-02-10 ENCOUNTER — Encounter: Payer: BC Managed Care – PPO | Admitting: Family Medicine

## 2023-02-16 ENCOUNTER — Encounter: Payer: Self-pay | Admitting: Family Medicine

## 2023-02-16 ENCOUNTER — Ambulatory Visit: Payer: BC Managed Care – PPO | Admitting: Family Medicine

## 2023-02-16 VITALS — BP 110/80 | Temp 98.0°F | Ht 65.0 in | Wt 215.4 lb

## 2023-02-16 DIAGNOSIS — K219 Gastro-esophageal reflux disease without esophagitis: Secondary | ICD-10-CM

## 2023-02-16 DIAGNOSIS — E669 Obesity, unspecified: Secondary | ICD-10-CM | POA: Diagnosis not present

## 2023-02-16 MED ORDER — ZEPBOUND 5 MG/0.5ML ~~LOC~~ SOAJ: 5.0000 mg | SUBCUTANEOUS | 1 refills | Status: DC

## 2023-02-16 NOTE — Assessment & Plan Note (Signed)
Chronic.  Has decreased since starting Zepbound  Continue omeprazole 40 mg as needed.

## 2023-02-16 NOTE — Progress Notes (Signed)
   SUBJECTIVE:   Chief Complaint  Patient presents with   Medical Management of Chronic Issues    2 mo follow up    HPI Patient presents for follow-up chronic disease management.  Weight loss management Started Zepbound 2.5 mg weekly and has been tolerating well.  Now on week 8.  Has lost 7 pounds since last visit.  Requesting increase in dosage.  Denies any nausea/vomiting, abdominal pain or constipation.  Portion control for meals.  Works out at gym.  GERD Has not had to use Prilosec since starting tirzepatide.  Weight has decreased 7 pounds.  PERTINENT PMH / PSH: Obesity class II GERD Lymphedema   OBJECTIVE:  BP 110/80   Temp 98 F (36.7 C) (Oral)   Ht 5\' 5"  (1.651 m)   Wt 215 lb 6.4 oz (97.7 kg)   LMP 01/19/2023 (Approximate)   BMI 35.84 kg/m    Physical Exam Vitals reviewed.  Constitutional:      General: She is not in acute distress.    Appearance: Normal appearance. She is normal weight. She is not ill-appearing, toxic-appearing or diaphoretic.  Eyes:     General:        Right eye: No discharge.        Left eye: No discharge.     Conjunctiva/sclera: Conjunctivae normal.  Cardiovascular:     Rate and Rhythm: Normal rate and regular rhythm.     Heart sounds: Normal heart sounds.  Pulmonary:     Effort: Pulmonary effort is normal.     Breath sounds: Normal breath sounds.  Abdominal:     General: Bowel sounds are normal.  Musculoskeletal:        General: Normal range of motion.  Skin:    General: Skin is warm and dry.  Neurological:     General: No focal deficit present.     Mental Status: She is alert and oriented to person, place, and time. Mental status is at baseline.  Psychiatric:        Mood and Affect: Mood normal.        Behavior: Behavior normal.        Thought Content: Thought content normal.        Judgment: Judgment normal.     ASSESSMENT/PLAN:  Obesity, Class II, BMI 35-39.9 Assessment & Plan: Chronic.  Weight down 7 pounds since  last visit. Doing well on tirzepatide 2.5 mg weekly Increase tirzepatide 5 mg weekly x 4 weeks, can continue to titrate based on weight loss.  Would recommend slowly titrating upward to avoid side effects. Instructions provided for patient to notify MD if plateauing weight.  Orders: -     Zepbound; Inject 5 mg into the skin once a week.  Dispense: 2 mL; Refill: 1  Gastroesophageal reflux disease, unspecified whether esophagitis present Assessment & Plan: Chronic.  Has decreased since starting Zepbound  Continue omeprazole 40 mg as needed.       PDMP reviewed  Return in about 3 months (around 05/18/2023) for PCP.  Dana Allan, MD

## 2023-02-16 NOTE — Patient Instructions (Signed)
It was a pleasure meeting you today. Thank you for allowing me to take part in your health care.  Our goals for today as we discussed include:  Congratulations on your 7lbs weight loss  Increase Zepbound to 5 mg weekly MyChart me on week 6 to let me know if needing to increase dose  Continue healthy diet and increase protein in diet Continue resistance training  Follow up in 3 months  Please arrive 15 minutes prior to your appointment.  Arrivals 5 minutes past your appointment time will need to be rescheduled.  This is to ensure that all patients are seen in a timely manner.  Thank you for your understanding and cooperation.   If you have any questions or concerns, please do not hesitate to call the office at [phone].  I look forward to our next visit and until then take care and stay safe.  Regards,   Carollee Leitz, MD   [facility]

## 2023-02-20 ENCOUNTER — Encounter: Payer: Self-pay | Admitting: Family Medicine

## 2023-02-20 NOTE — Assessment & Plan Note (Signed)
Chronic.  Weight down 7 pounds since last visit. Doing well on tirzepatide 2.5 mg weekly Increase tirzepatide 5 mg weekly x 4 weeks, can continue to titrate based on weight loss.  Would recommend slowly titrating upward to avoid side effects. Instructions provided for patient to notify MD if plateauing weight.

## 2023-03-24 ENCOUNTER — Encounter: Payer: Self-pay | Admitting: Family Medicine

## 2023-03-24 NOTE — Telephone Encounter (Signed)
Patient advised to contact additional pharmacies to see if they have in stock, or if they have the 1mg  in stock. Patient will let us know.  Do you have any other recommendations? Thanks!

## 2023-05-18 ENCOUNTER — Ambulatory Visit: Payer: BC Managed Care – PPO | Admitting: Family Medicine

## 2023-05-18 ENCOUNTER — Encounter: Payer: Self-pay | Admitting: Family Medicine

## 2023-05-18 VITALS — BP 122/76 | HR 88 | Temp 97.7°F | Resp 16 | Ht 65.0 in | Wt 215.1 lb

## 2023-05-18 DIAGNOSIS — E669 Obesity, unspecified: Secondary | ICD-10-CM | POA: Diagnosis not present

## 2023-05-18 DIAGNOSIS — Z6835 Body mass index (BMI) 35.0-35.9, adult: Secondary | ICD-10-CM | POA: Diagnosis not present

## 2023-05-18 NOTE — Patient Instructions (Addendum)
It was a pleasure meeting you today. Thank you for allowing me to take part in your health care.  Our goals for today as we discussed include:  Great job on the weight loss of 7 lbs.   Continue healthy lifestyle Portion size meals and preplanning  Consider Qsymia for weight loss  Look at [facility] website to see if this may be an option for you with your weight loss. [facility] [address] [place] [phone]    Can call other pharmacies to see if Zepbound or Reginal Lutes are available. Would need to restart at the lowest dose.     If you have any questions or concerns, please do not hesitate to call the office at [phone].  I look forward to our next visit and until then take care and stay safe.  Regards,   Dana Allan, MD   [facility]

## 2023-05-18 NOTE — Progress Notes (Signed)
   SUBJECTIVE:   Chief Complaint  Patient presents with   Weight Check   HPI Patient presents for follow-up chronic disease management.  Weight loss management Was doing well on Zepbound 5 mg weekly.  Has been off medication for as not able to obtain.  Has continued to maintain weight.  Diet has been good and has continued to work out at gym.   Plans to continue weight loss without medication.  PERTINENT PMH / PSH: Obesity class II GERD Lymphedema   OBJECTIVE:  BP 122/76   Pulse 88   Temp 97.7 F (36.5 C)   Resp 16   Ht 5\' 5"  (1.651 m)   Wt 215 lb 2 oz (97.6 kg)   LMP 05/14/2023 (Exact Date)   SpO2 99%   BMI 35.80 kg/m    Physical Exam Vitals reviewed.  Constitutional:      General: She is not in acute distress.    Appearance: Normal appearance. She is obese. She is not ill-appearing, toxic-appearing or diaphoretic.  Eyes:     General:        Right eye: No discharge.        Left eye: No discharge.     Conjunctiva/sclera: Conjunctivae normal.  Cardiovascular:     Rate and Rhythm: Normal rate and regular rhythm.     Heart sounds: Normal heart sounds.  Pulmonary:     Effort: Pulmonary effort is normal.     Breath sounds: Normal breath sounds.  Abdominal:     General: Bowel sounds are normal.  Musculoskeletal:        General: Normal range of motion.  Skin:    General: Skin is warm and dry.  Neurological:     General: No focal deficit present.     Mental Status: She is alert and oriented to person, place, and time. Mental status is at baseline.  Psychiatric:        Mood and Affect: Mood normal.        Behavior: Behavior normal.        Thought Content: Thought content normal.        Judgment: Judgment normal.     ASSESSMENT/PLAN:  Obesity, Class II, BMI 35-39.9 Assessment & Plan: Chronic. Weight maintained  Continue healthy diet, portion control and lifestyle Recommended [facility], information provided Discussed initiation of Qsymia,  risks and benefits.  Patient will consider in future. Follow up as needed     PDMP reviewed  Return if symptoms worsen or fail to improve.  Dana Allan, MD

## 2023-06-15 ENCOUNTER — Other Ambulatory Visit: Payer: Self-pay | Admitting: Family Medicine

## 2023-06-15 ENCOUNTER — Encounter: Payer: Self-pay | Admitting: Family Medicine

## 2023-06-15 DIAGNOSIS — E669 Obesity, unspecified: Secondary | ICD-10-CM

## 2023-06-15 MED ORDER — QSYMIA 3.75-23 MG PO CP24: 1.0000 | ORAL_CAPSULE | Freq: Every day | ORAL | 0 refills | Status: DC

## 2023-06-15 MED ORDER — QSYMIA 7.5-46 MG PO CP24: 1.0000 | ORAL_CAPSULE | Freq: Every day | ORAL | 0 refills | Status: DC

## 2023-06-15 NOTE — Assessment & Plan Note (Addendum)
Chronic. Weight maintained  Continue healthy diet, portion control and lifestyle Recommended [facility], information provided Discussed initiation of Qsymia, risks and benefits.  Patient will consider in future. Follow up as needed

## 2023-06-16 ENCOUNTER — Telehealth: Payer: Self-pay

## 2023-06-16 ENCOUNTER — Other Ambulatory Visit (HOSPITAL_COMMUNITY): Payer: Self-pay

## 2023-06-16 NOTE — Telephone Encounter (Signed)
Pharmacy Patient Advocate Encounter   Received notification from Patient Advice Request messages that prior authorization for QSYMIA 3.75 MG-23 MG CAPSULE is required/requested.   Insurance verification completed.   The patient is insured through [employer] .   Per test claim: PA required; PA submitted to [employer] via Prompt PA Key/confirmation #/EOC [id number] Status is pending   [email or web address]

## 2023-06-17 ENCOUNTER — Other Ambulatory Visit (HOSPITAL_COMMUNITY): Payer: Self-pay

## 2023-06-20 ENCOUNTER — Other Ambulatory Visit (HOSPITAL_COMMUNITY): Payer: Self-pay

## 2023-06-20 NOTE — Telephone Encounter (Signed)
Called pt and informed her that her Rx PA was approved.

## 2023-06-20 NOTE — Telephone Encounter (Signed)
Pt informed of this information.  

## 2023-06-20 NOTE — Telephone Encounter (Signed)
Pharmacy Patient Advocate Encounter  Received notification from  [employer]  that Prior Authorization for Qsymia 3.75 MG-23MG  has been APPROVED from 06/16/23 to 12/17/23. Ran test claim, Copay is $4  PA #/Case ID/Reference #: [id number]

## 2023-10-18 ENCOUNTER — Ambulatory Visit: Payer: BC Managed Care – PPO | Admitting: Family Medicine

## 2023-10-18 ENCOUNTER — Encounter: Payer: Self-pay | Admitting: Family Medicine

## 2023-10-18 VITALS — BP 120/76 | HR 81 | Temp 98.1°F | Ht 65.0 in | Wt 204.4 lb

## 2023-10-18 DIAGNOSIS — K219 Gastro-esophageal reflux disease without esophagitis: Secondary | ICD-10-CM | POA: Diagnosis not present

## 2023-10-18 DIAGNOSIS — Z6834 Body mass index (BMI) 34.0-34.9, adult: Secondary | ICD-10-CM

## 2023-10-18 DIAGNOSIS — E66812 Obesity, class 2: Secondary | ICD-10-CM | POA: Diagnosis not present

## 2023-10-18 LAB — COMPREHENSIVE METABOLIC PANEL
ALT: 11 U/L (ref 0–35)
AST: 10 U/L (ref 0–37)
Albumin: 4.1 g/dL (ref 3.5–5.2)
Alkaline Phosphatase: 73 U/L (ref 39–117)
BUN: 15 mg/dL (ref 6–23)
CO2: 27 meq/L (ref 19–32)
Calcium: 9.2 mg/dL (ref 8.4–10.5)
Chloride: 109 meq/L (ref 96–112)
Creatinine, Ser: 0.86 mg/dL (ref 0.40–1.20)
GFR: 85.36 mL/min (ref >60.00)
Glucose, Bld: 91 mg/dL (ref 70–99)
Potassium: 3.9 meq/L (ref 3.5–5.1)
Sodium: 140 meq/L (ref 135–145)
Total Bilirubin: 0.7 mg/dL (ref 0.2–1.2)
Total Protein: 7 g/dL (ref 6.0–8.3)

## 2023-10-18 LAB — CBC
HCT: 40.7 % (ref 36.0–46.0)
Hemoglobin: 13.7 g/dL (ref 12.0–15.0)
MCHC: 33.6 g/dL (ref 30.0–36.0)
MCV: 94.1 fL (ref 78.0–100.0)
Platelets: 238 10*3/uL (ref 150.0–400.0)
RBC: 4.33 Mil/uL (ref 3.87–5.11)
RDW: 14.1 % (ref 11.5–15.5)
WBC: 3.9 10*3/uL — ABNORMAL LOW (ref 4.0–10.5)

## 2023-10-18 LAB — TSH: TSH: 0.67 u[IU]/mL (ref 0.35–5.50)

## 2023-10-18 LAB — HEMOGLOBIN A1C: Hgb A1c MFr Bld: 5.4 % (ref 4.6–6.5)

## 2023-10-18 MED ORDER — QSYMIA 7.5-46 MG PO CP24: 1.0000 | ORAL_CAPSULE | Freq: Every day | ORAL | 0 refills | Status: DC

## 2023-10-18 NOTE — Progress Notes (Signed)
SUBJECTIVE:   Chief Complaint  Patient presents with   Medical Management of Chronic Issues    Weight loss f/up   HPI Presents to clinic for follow up weight loss management  Discussed the use of AI scribe software for clinical note transcription with the patient, who gave verbal consent to proceed.  History of Present Illness The patient, who has been on a weight loss regimen, presents with concerns about recent weight loss. They report that they have been losing weight and can see the changes in their body and the way their clothes fit. However, they are unsure if the weight loss is due to the medication or increased stress from a new position at work. They note that they have been on the medication for a while and have been taking the higher dose for the past three months.  The patient reports a significant change in their eating habits. Previously, they were eating smaller meals throughout the day, but now they struggle to eat even one full meal a day. They attribute this change to the stress from their new job. They note that their weight loss seemed to accelerate with the onset of this stress, losing five pounds in a week.  The patient also reports fluctuations in their appetite. On some days, they hardly eat anything, while on other days, they feel like they can't stop eating. They are unsure if this is due to the medication or the stress. They express concern about the sustainability of their current situation and are considering looking for another job due to the high stress levels.  The patient denies any chest pain or shortness of breath. They have been tolerating the weight loss medication well and have noticed a decrease in their acid reflux symptoms. They have been on the medication for about three months and have lost approximately 11 pounds during this time.    PERTINENT PMH / PSH: As above  OBJECTIVE:  BP 120/76   Pulse 81   Temp 98.1 F (36.7 C) (Oral)   Ht 5\' 5"   (1.651 m)   Wt 204 lb 6.4 oz (92.7 kg)   SpO2 97%   BMI 34.01 kg/m    Physical Exam Vitals reviewed.  Constitutional:      General: She is not in acute distress.    Appearance: Normal appearance. She is obese. She is not ill-appearing, toxic-appearing or diaphoretic.  Eyes:     General:        Right eye: No discharge.        Left eye: No discharge.     Conjunctiva/sclera: Conjunctivae normal.  Cardiovascular:     Rate and Rhythm: Normal rate and regular rhythm.     Heart sounds: Normal heart sounds.  Pulmonary:     Effort: Pulmonary effort is normal.     Breath sounds: Normal breath sounds.  Abdominal:     General: Bowel sounds are normal.  Skin:    General: Skin is warm.  Neurological:     General: No focal deficit present.     Mental Status: She is alert and oriented to person, place, and time. Mental status is at baseline.  Psychiatric:        Mood and Affect: Mood normal.        Behavior: Behavior normal.        Thought Content: Thought content normal.        Judgment: Judgment normal.        10/18/2023    8:49  AM 05/18/2023    8:10 AM 02/16/2023    8:11 AM 12/14/2022    2:10 PM 11/09/2022    9:05 AM  Depression screen PHQ 2/9  Decreased Interest 1 0 0 0 0  Down, Depressed, Hopeless 0 0 0 0 0  PHQ - 2 Score 1 0 0 0 0  Altered sleeping 0 1     Tired, decreased energy 1 1     Change in appetite  0     Feeling bad or failure about yourself  0 0     Trouble concentrating 1 0     Moving slowly or fidgety/restless 0 0     Suicidal thoughts 0 0     PHQ-9 Score 3 2     Difficult doing work/chores Somewhat difficult Not difficult at all         10/18/2023    8:49 AM 05/18/2023    8:10 AM 02/16/2023    8:12 AM 11/09/2022    9:05 AM  GAD 7 : Generalized Anxiety Score  Nervous, Anxious, on Edge 0 0 0 0  Control/stop worrying 2 1 0 0  Worry too much - different things 2 1 0 0  Trouble relaxing 2 0 0 0  Restless 0 0 0 0  Easily annoyed or irritable 1 0 0 0  Afraid -  awful might happen 0 0 0 0  Total GAD 7 Score 7 2 0 0  Anxiety Difficulty Somewhat difficult Not difficult at all Not difficult at all Not difficult at all    ASSESSMENT/PLAN:  Obesity, Class II, BMI 35-39.9 Assessment & Plan: Noted weight loss with medication and lifestyle changes. Patient is currently on 7.5mg  of weight loss medication. However, recent stress from work has led to inconsistent eating habits and rapid weight loss. Discussed the importance of maintaining a balanced diet and managing stress levels. -Continue current dose of weight loss medication. -Consider stress management strategies or possible medication for stress relief. -Check thyroid, kidney, and liver function to rule out other causes of rapid weight loss. -Schedule follow-up in 3 months or sooner if patient decides to address stress or increase weight loss medication dose.  Orders: -     Hemoglobin A1c -     TSH -     Comprehensive metabolic panel -     CBC -     Qsymia; Take 1 capsule by mouth daily.  Dispense: 90 capsule; Refill: 0  Gastroesophageal reflux disease, unspecified whether esophagitis present Assessment & Plan: Chronic.  Has decreased since weight loss.   Self discontinued omeprazole  -Continue current management strategies.    PDMP reviewed  Return in about 3 months (around 01/16/2024) for PCP, weight management.  Dana Allan, MD

## 2023-10-18 NOTE — Assessment & Plan Note (Addendum)
Chronic.  Has decreased since weight loss.   Self discontinued omeprazole  -Continue current management strategies.

## 2023-10-18 NOTE — Assessment & Plan Note (Signed)
Noted weight loss with medication and lifestyle changes. Patient is currently on 7.5mg  of weight loss medication. However, recent stress from work has led to inconsistent eating habits and rapid weight loss. Discussed the importance of maintaining a balanced diet and managing stress levels. -Continue current dose of weight loss medication. -Consider stress management strategies or possible medication for stress relief. -Check thyroid, kidney, and liver function to rule out other causes of rapid weight loss. -Schedule follow-up in 3 months or sooner if patient decides to address stress or increase weight loss medication dose.

## 2023-10-18 NOTE — Patient Instructions (Signed)
It was a pleasure meeting you today. Thank you for allowing me to take part in your health care.  Our goals for today as we discussed include:  We will get some labs today.  If they are abnormal or we need to do something about them, I will call you.  If they are normal, I will send you a message on MyChart (if it is active) or a letter in the mail.  If you don't hear from [place] in 2 weeks, please call the office at the number below.   Refill sent for requested medication  Follow up in 3 months  This is a list of the screening recommended for you and due dates:  Health Maintenance  Topic Date Due   Flu Shot  06/16/2023   Pap with HPV screening  11/06/2026   DTaP/Tdap/Td vaccine (2 - Td or Tdap) 11/09/2032   COVID-19 Vaccine  Completed   Hepatitis C Screening  Completed   HIV Screening  Completed   HPV Vaccine  Aged Out     If you have any questions or concerns, please do not hesitate to call the office at [phone].  I look forward to our next visit and until then take care and stay safe.  Regards,   Dana Allan, MD   [facility]

## 2023-10-25 ENCOUNTER — Encounter: Payer: Self-pay | Admitting: Family Medicine

## 2023-10-27 DIAGNOSIS — J349 Unspecified disorder of nose and nasal sinuses: Secondary | ICD-10-CM | POA: Diagnosis not present

## 2023-10-27 DIAGNOSIS — J029 Acute pharyngitis, unspecified: Secondary | ICD-10-CM | POA: Diagnosis not present

## 2023-12-19 DIAGNOSIS — J069 Acute upper respiratory infection, unspecified: Secondary | ICD-10-CM | POA: Diagnosis not present

## 2024-01-16 ENCOUNTER — Encounter: Payer: Self-pay | Admitting: Family Medicine

## 2024-01-16 ENCOUNTER — Ambulatory Visit: Payer: BC Managed Care – PPO | Admitting: Family Medicine

## 2024-01-16 VITALS — BP 110/70 | HR 92 | Temp 98.0°F | Resp 18 | Ht 65.0 in | Wt 202.1 lb

## 2024-01-16 DIAGNOSIS — E66812 Obesity, class 2: Secondary | ICD-10-CM

## 2024-01-16 DIAGNOSIS — Z6833 Body mass index (BMI) 33.0-33.9, adult: Secondary | ICD-10-CM | POA: Diagnosis not present

## 2024-01-16 MED ORDER — QSYMIA 11.25-69 MG PO CP24: 1.0000 | ORAL_CAPSULE | Freq: Every day | ORAL | 0 refills | Status: DC

## 2024-01-16 NOTE — Patient Instructions (Addendum)
 It was a pleasure meeting you today. Thank you for allowing me to take part in your health care.  Our goals for today as we discussed include:  Increase Qsymia dose to 11.25-69 mg daily Follow up in 2 weeks  Starting weight 215.  Today's weight 202 lbs.   Continue current healthy lifestyle and activity   This is a list of the screening recommended for you and due dates:  Health Maintenance  Topic Date Due   Pap with HPV screening  11/06/2026   DTaP/Tdap/Td vaccine (2 - Td or Tdap) 11/09/2032   Flu Shot  Completed   COVID-19 Vaccine  Completed   Hepatitis C Screening  Completed   HIV Screening  Completed   HPV Vaccine  Aged Out      If you have any questions or concerns, please do not hesitate to call the office at [phone].  I look forward to our next visit and until then take care and stay safe.  Regards,   Dana Allan, MD   [facility]

## 2024-01-16 NOTE — Progress Notes (Signed)
 SUBJECTIVE:   Chief Complaint  Patient presents with   Weight Check   HPI Presents to clinic for follow up wight management  Discussed the use of AI scribe software for clinical note transcription with the patient, who gave verbal consent to proceed.  History of Present Illness Emily Griffin is a 38 year old female who presents with follow-up for weight loss management.  Her starting weight was approximately 218 pounds, and her current weight is around 202 pounds. She has been on Qsymia, initially starting with a lower dose for two weeks, followed by a larger dose of 7.5 mg for approximately five to six months. During this period, she experienced a weight loss of about two pounds on the larger dose over three months. Initially, she had appetite suppression, but recently her appetite has started to return. She was unable to obtain Zepbound due to insurance issues.  In terms of her diet and physical activity, she reports eating well and engaging in gym activities at least two days a week for about 30 minutes each session. She is managing stress from work, which was discussed as a potential factor in her weight loss journey.  No chest pain or shortness of breath. Her appetite was initially suppressed but has recently started to return.      PERTINENT PMH / PSH: As above  OBJECTIVE:  BP 110/70   Pulse 92   Temp 98 F (36.7 C)   Resp 18   Ht 5\' 5"  (1.651 m)   Wt 202 lb 2 oz (91.7 kg)   LMP 01/11/2024   SpO2 99%   BMI 33.64 kg/m    Physical Exam Vitals reviewed.  Constitutional:      General: She is not in acute distress.    Appearance: Normal appearance. She is obese. She is not ill-appearing, toxic-appearing or diaphoretic.  Eyes:     General:        Right eye: No discharge.        Left eye: No discharge.     Conjunctiva/sclera: Conjunctivae normal.  Cardiovascular:     Rate and Rhythm: Normal rate and regular rhythm.     Heart sounds: Normal heart sounds.   Pulmonary:     Effort: Pulmonary effort is normal.     Breath sounds: Normal breath sounds.  Musculoskeletal:        General: Normal range of motion.  Skin:    General: Skin is warm and dry.  Neurological:     General: No focal deficit present.     Mental Status: She is alert and oriented to person, place, and time. Mental status is at baseline.  Psychiatric:        Mood and Affect: Mood normal.        Behavior: Behavior normal.        Thought Content: Thought content normal.        Judgment: Judgment normal.           01/16/2024    8:25 AM 10/18/2023    8:49 AM 05/18/2023    8:10 AM 02/16/2023    8:11 AM 12/14/2022    2:10 PM  Depression screen PHQ 2/9  Decreased Interest 0 1 0 0 0  Down, Depressed, Hopeless 0 0 0 0 0  PHQ - 2 Score 0 1 0 0 0  Altered sleeping 0 0 1    Tired, decreased energy 1 1 1     Change in appetite 1  0    Feeling  bad or failure about yourself  0 0 0    Trouble concentrating 0 1 0    Moving slowly or fidgety/restless 0 0 0    Suicidal thoughts 0 0 0    PHQ-9 Score 2 3 2     Difficult doing work/chores Not difficult at all Somewhat difficult Not difficult at all        01/16/2024    8:25 AM 10/18/2023    8:49 AM 05/18/2023    8:10 AM 02/16/2023    8:12 AM  GAD 7 : Generalized Anxiety Score  Nervous, Anxious, on Edge 0 0 0 0  Control/stop worrying 0 2 1 0  Worry too much - different things 1 2 1  0  Trouble relaxing 0 2 0 0  Restless 0 0 0 0  Easily annoyed or irritable 0 1 0 0  Afraid - awful might happen 0 0 0 0  Total GAD 7 Score 1 7 2  0  Anxiety Difficulty Not difficult at all Somewhat difficult Not difficult at all Not difficult at all    ASSESSMENT/PLAN:  Obesity, Class II, BMI 35-39.9 Assessment & Plan: Patient reports improved diet and regular exercise (at least two days per week for at least 30 minutes). Minimal weight loss on Qsymia 7.5mg -46mg . Patient reports initial appetite suppression, but this effect seems to have diminished over  time. Discussed the possibility of increasing the dose, but also acknowledged that if the patient has not achieved a 3% weight loss at the current dose, it is unlikely that increasing the dose will be effective. -Increase Qsymia 11.25-69 mg daily -Discontinue the current dose of Qsymia. -Follow up in two weeks to assess tolerance and effectiveness of the increased dose. -Encourage continuation of healthy diet and regular exercise.  Orders: -     Qsymia; Take 1 tablet by mouth daily.  Dispense: 30 capsule; Refill: 0    PDMP reviewed  Return in about 2 weeks (around 01/30/2024) for PCP, Weight management.  Dana Allan, MD

## 2024-01-17 ENCOUNTER — Other Ambulatory Visit (HOSPITAL_COMMUNITY): Payer: Self-pay

## 2024-01-17 ENCOUNTER — Telehealth: Payer: Self-pay

## 2024-01-17 NOTE — Telephone Encounter (Signed)
 Pharmacy Patient Advocate Encounter   Received notification from  [facility] Portal that prior authorization for QSYMIA 11.25 MG-69 MG CAPSULE is required/requested.   Insurance verification completed.   The patient is insured through [employer] .   Per test claim: PA required; PA submitted to above mentioned insurance via Prompt PA Key/confirmation #/EOC [id number] Status is pending

## 2024-01-19 NOTE — Telephone Encounter (Signed)
 Pharmacy Patient Advocate Encounter  Received notification from [employer] that Prior Authorization for Qsymia 11.25mg -69mg  caps has been APPROVED from 01/17/24 to 01/15/25   PA #/Case ID/Reference #: [id number]  Approval letter indexed to media tab

## 2024-01-19 NOTE — Telephone Encounter (Addendum)
 Left message to return call to our office.  Called pt to let her know that medication has been approved.

## 2024-01-20 NOTE — Telephone Encounter (Signed)
Called pt and informed her of this information.

## 2024-01-22 ENCOUNTER — Encounter: Payer: Self-pay | Admitting: Family Medicine

## 2024-01-22 NOTE — Assessment & Plan Note (Addendum)
 Patient reports improved diet and regular exercise (at least two days per week for at least 30 minutes). Minimal weight loss on Qsymia 7.5mg -46mg . Patient reports initial appetite suppression, but this effect seems to have diminished over time. Discussed the possibility of increasing the dose, but also acknowledged that if the patient has not achieved a 3% weight loss at the current dose, it is unlikely that increasing the dose will be effective. -Increase Qsymia 11.25-69 mg daily -Discontinue the current dose of Qsymia. -Follow up in two weeks to assess tolerance and effectiveness of the increased dose. -Encourage continuation of healthy diet and regular exercise.

## 2024-01-31 ENCOUNTER — Ambulatory Visit: Admitting: Family Medicine

## 2024-03-13 ENCOUNTER — Telehealth: Payer: Self-pay | Admitting: Family Medicine

## 2024-03-13 NOTE — Telephone Encounter (Signed)
 Patient need lab orders.

## 2024-03-16 ENCOUNTER — Ambulatory Visit: Admitting: Family Medicine

## 2024-03-20 ENCOUNTER — Ambulatory Visit: Admitting: Family Medicine

## 2024-03-20 ENCOUNTER — Encounter: Payer: Self-pay | Admitting: Family Medicine

## 2024-03-20 DIAGNOSIS — E66812 Obesity, class 2: Secondary | ICD-10-CM

## 2024-03-20 MED ORDER — QSYMIA 11.25-69 MG PO CP24: 1.0000 | ORAL_CAPSULE | Freq: Every day | ORAL | 3 refills | Status: DC

## 2024-03-20 NOTE — Patient Instructions (Addendum)
 It was a pleasure meeting you today. Thank you for allowing me to take part in your health care.  Our goals for today as we discussed include:  Refill sent for Qsymia  at same dose  Continue healthy diet and increase activity  Look at [facility] website to see if this may be an option for you with your weight loss. [facility] [address] [place] [phone]   [facility] [address] [place] [phone]  Can check out [facility] for Zepbound  vial [email or web address]      This is a list of the screening recommended for you and due dates:  Health Maintenance  Topic Date Due   Flu Shot  06/15/2024   Pap with HPV screening  11/06/2026   DTaP/Tdap/Td vaccine (2 - Td or Tdap) 11/09/2032   COVID-19 Vaccine  Completed   Hepatitis C Screening  Completed   HIV Screening  Completed   HPV Vaccine  Aged Out   Meningitis B Vaccine  Aged Out     If you have any questions or concerns, please do not hesitate to call the office at [phone].  I look forward to our next visit and until then take care and stay safe.  Regards,   Valli Gaw, MD   [facility]

## 2024-03-20 NOTE — Progress Notes (Signed)
 SUBJECTIVE:   Chief Complaint  Patient presents with   Medication Consultation   HPI Presents for follow up chronic disease management  Discussed the use of AI scribe software for clinical note transcription with the patient, who gave verbal consent to proceed.  History of Present Illness Emily Griffin is a 38 year old female who presents for a follow-up on her weight management medication.  She has been on a new dose of her weight management medication, phentermine -topiramate, for approximately three weeks. The current dose of 11.25 mg is more effective than the previous lower dose in curbing her appetite without causing jitteriness, a side effect she was concerned about due to past experiences with phentermine  alone.  She notes a weight loss of three pounds since the last visit, contributing to a total weight loss of approximately 23 pounds since starting the medication regimen. She has one week left on her current bottle of medication and is seeking a refill.    PERTINENT PMH / PSH: As above  OBJECTIVE:  BP 103/71   Pulse 87   Temp 98 F (36.7 C)   Resp 20   Ht 5\' 5"  (1.651 m)   Wt 199 lb 6 oz (90.4 kg)   LMP 03/05/2024   SpO2 99%   BMI 33.18 kg/m    Physical Exam Vitals reviewed.  Constitutional:      General: She is not in acute distress.    Appearance: Normal appearance. She is obese. She is not ill-appearing, toxic-appearing or diaphoretic.  Eyes:     General:        Right eye: No discharge.        Left eye: No discharge.     Conjunctiva/sclera: Conjunctivae normal.  Cardiovascular:     Rate and Rhythm: Normal rate and regular rhythm.     Heart sounds: Normal heart sounds.  Pulmonary:     Effort: Pulmonary effort is normal.     Breath sounds: Normal breath sounds.  Abdominal:     General: Bowel sounds are normal.  Musculoskeletal:        General: Normal range of motion.  Skin:    General: Skin is warm and dry.  Neurological:     General: No focal  deficit present.     Mental Status: She is alert and oriented to person, place, and time. Mental status is at baseline.  Psychiatric:        Mood and Affect: Mood normal.        Behavior: Behavior normal.        Thought Content: Thought content normal.        Judgment: Judgment normal.           03/20/2024    8:25 AM 01/16/2024    8:25 AM 10/18/2023    8:49 AM 05/18/2023    8:10 AM 02/16/2023    8:11 AM  Depression screen PHQ 2/9  Decreased Interest 0 0 1 0 0  Down, Depressed, Hopeless 0 0 0 0 0  PHQ - 2 Score 0 0 1 0 0  Altered sleeping 0 0 0 1   Tired, decreased energy 1 1 1 1    Change in appetite 0 1  0   Feeling bad or failure about yourself  0 0 0 0   Trouble concentrating 0 0 1 0   Moving slowly or fidgety/restless 0 0 0 0   Suicidal thoughts 0 0 0 0   PHQ-9 Score 1 2 3  2  Difficult doing work/chores Not difficult at all Not difficult at all Somewhat difficult Not difficult at all       03/20/2024    8:25 AM 01/16/2024    8:25 AM 10/18/2023    8:49 AM 05/18/2023    8:10 AM  GAD 7 : Generalized Anxiety Score  Nervous, Anxious, on Edge 0 0 0 0  Control/stop worrying 0 0 2 1  Worry too much - different things 0 1 2 1   Trouble relaxing 0 0 2 0  Restless 0 0 0 0  Easily annoyed or irritable 1 0 1 0  Afraid - awful might happen 0 0 0 0  Total GAD 7 Score 1 1 7 2   Anxiety Difficulty Not difficult at all Not difficult at all Somewhat difficult Not difficult at all    ASSESSMENT/PLAN:  Obesity, Class II, BMI 35-39.9 Assessment & Plan: Effective weight management with phentermine /topiramate. Total weight loss of 23 pounds. Current dose well-tolerated. - Refill phentermine /topiramate 11.25- 69 mg daily - Recommend [facility] for continued weight loss management   Orders: -     Qsymia ; Take 1 tablet by mouth daily.  Dispense: 30 capsule; Refill: 3    PDMP reviewed   Return if symptoms worsen or fail to improve, for PCP.  Valli Gaw, MD

## 2024-03-25 ENCOUNTER — Encounter: Payer: Self-pay | Admitting: Family Medicine

## 2024-03-25 NOTE — Assessment & Plan Note (Signed)
 Effective weight management with phentermine /topiramate. Total weight loss of 23 pounds. Current dose well-tolerated. - Refill phentermine /topiramate 11.25- 69 mg daily - Recommend [facility] for continued weight loss management

## 2024-04-18 DIAGNOSIS — S30861A Insect bite (nonvenomous) of abdominal wall, initial encounter: Secondary | ICD-10-CM | POA: Diagnosis not present

## 2024-05-31 ENCOUNTER — Other Ambulatory Visit: Payer: Self-pay

## 2024-05-31 NOTE — Telephone Encounter (Signed)
 Left message to return call to our office. Looks like Dr. Hope sent in a 30 day supply with 3 refills in May. Called to see if she requested the medication refill. Phentermine -Topiramate (QSYMIA ) 11.25-69 MG CP24

## 2024-07-10 ENCOUNTER — Ambulatory Visit: Admitting: Nurse Practitioner

## 2024-07-10 VITALS — BP 118/88 | HR 78 | Temp 98.0°F | Ht 65.0 in | Wt 201.0 lb

## 2024-07-10 DIAGNOSIS — Z6833 Body mass index (BMI) 33.0-33.9, adult: Secondary | ICD-10-CM | POA: Diagnosis not present

## 2024-07-10 DIAGNOSIS — K219 Gastro-esophageal reflux disease without esophagitis: Secondary | ICD-10-CM

## 2024-07-10 DIAGNOSIS — E66811 Obesity, class 1: Secondary | ICD-10-CM

## 2024-07-10 DIAGNOSIS — E6609 Other obesity due to excess calories: Secondary | ICD-10-CM

## 2024-07-10 MED ORDER — ZEPBOUND 2.5 MG/0.5ML ~~LOC~~ SOAJ: 2.5000 mg | SUBCUTANEOUS | 0 refills | Status: DC

## 2024-07-10 MED ORDER — ZEPBOUND 5 MG/0.5ML ~~LOC~~ SOAJ: 5.0000 mg | SUBCUTANEOUS | 0 refills | Status: DC

## 2024-07-10 MED ORDER — OMEPRAZOLE 20 MG PO CPDR: 20.0000 mg | DELAYED_RELEASE_CAPSULE | Freq: Two times a day (BID) | ORAL | 3 refills | Status: AC

## 2024-07-10 NOTE — Progress Notes (Signed)
 Leron Glance, NP-C Phone: [phone]  Emily Griffin is a 38 y.o. female who presents today for transfer of care.   Discussed the use of AI scribe software for clinical note transcription with the patient, who gave verbal consent to proceed.  History of Present Illness   Emily Griffin is a 38 year old female with a history of acid reflux who presents for transfer of care with worsening symptoms of acid reflux.  She has a history of acid reflux, which had previously improved with weight loss. She was on omeprazole  for over a year but discontinued it due to concerns about long-term use. Recently, her symptoms have worsened over the past two weeks, coinciding with increased consumption of caffeine, citrus fruits, and energy drinks. She experiences dysphagia and coughs up hard, gold-colored mucus. She has resumed taking omeprazole , using leftover medication from a previous prescription, but is unsure if she has an active prescription.  Regarding her weight management, she was previously on Zepbound  and lost approximately 15 pounds. However, due to pharmacy issues, she switched to Qsymia , which led to inconsistent use and a weight loss of only one pound over the last six months. She reports difficulties with pharmacy supply, which affected her medication adherence. Her diet has been inconsistent, often skipping breakfast and lunch, leading to overeating at dinner. She has not been exercising regularly due to a new job and time constraints.  She has a past medical history of deep vein thrombosis (DVT) in 2014, which was unprovoked and led to discontinuation of birth control. Extensive workup, including hematology consultation, did not identify a specific cause for the DVT.      Social History   Tobacco Use  Smoking Status Never  Smokeless Tobacco Never    Current Outpatient Medications on File Prior to Visit  Medication Sig Dispense Refill   albuterol  (VENTOLIN  HFA) 108 (90 Base) MCG/ACT inhaler  Inhale 2 puffs into the lungs every 6 (six) hours as needed for wheezing or shortness of breath. 18 g 2   fexofenadine (ALLEGRA ALLERGY) 180 MG tablet Take 180 mg by mouth daily.     fluticasone (FLONASE) 50 MCG/ACT nasal spray Place into both nostrils daily.     levonorgestrel  (MIRENA ) 20 MCG/DAY IUD 1 each by Intrauterine route once.     Multiple Vitamins-Minerals (WOMENS MULTIVITAMIN PO) Take by mouth daily. Vitafusion     VITAMIN D  PO Take by mouth. (Patient not taking: Reported on 07/10/2024)     No current facility-administered medications on file prior to visit.     ROS see history of present illness  Objective  Physical Exam Vitals:   07/10/24 1126  BP: 118/88  Pulse: 78  Temp: 98 F (36.7 C)  SpO2: 94%    BP Readings from Last 3 Encounters:  07/10/24 118/88  03/20/24 103/71  01/16/24 110/70   Wt Readings from Last 3 Encounters:  07/10/24 201 lb (91.2 kg)  03/20/24 199 lb 6 oz (90.4 kg)  01/16/24 202 lb 2 oz (91.7 kg)    Physical Exam Constitutional:      General: She is not in acute distress.    Appearance: Normal appearance.  HENT:     Head: Normocephalic.  Cardiovascular:     Rate and Rhythm: Normal rate and regular rhythm.     Heart sounds: Normal heart sounds.  Pulmonary:     Effort: Pulmonary effort is normal.     Breath sounds: Normal breath sounds.  Skin:    General: Skin is warm and  dry.  Neurological:     General: No focal deficit present.     Mental Status: She is alert.  Psychiatric:        Mood and Affect: Mood normal.        Behavior: Behavior normal.      Assessment/Plan: Please see individual problem list.  Class 1 obesity due to excess calories without serious comorbidity with body mass index (BMI) of 33.0 to 33.9 in adult Assessment & Plan: She has obesity and previously used Zepbound , preferring it over Qsymia  due to concerns about phentermine . Minimal weight loss occurred due to inconsistent Qsymia  use and lifestyle changes.  Discontinue Qsymia  and prescribe Zepbound , starting at 2.5 mg weekly for four weeks. Counseled on the risk of pancreatitis and gallbladder disease. Discussed the risk of nausea. Advised to discontinue the Zepbound  and contact us  immediately if they develop abdominal pain. If they develop excessive nausea they will contact us  right away. I discussed that medullary thyroid  cancer has been seen in rats studies. The patient confirmed no personal or family history of thyroid  cancer, parathyroid cancer, or adrenal gland cancer. Discussed that we thus far have not seen medullary thyroid  cancer result from use of this type of medication in humans. Advised to monitor the thyroid  area and contact us  for any lumps, swelling, trouble swallowing, or any other changes in this area.  Discussed goal weight loss of 1 to 2 pounds a week while on this medication. Discussed the importance of healthy diet, exercise and lifestyle modifications even with this medication. Schedule a follow-up in six weeks to assess weight and treatment response.  Orders: -     Zepbound ; Inject 2.5 mg into the skin once a week. X 4 weeks then increase to 5 mg  Dispense: 2 mL; Refill: 0 -     Zepbound ; Inject 5 mg into the skin once a week.  Dispense: 2 mL; Refill: 0  Gastroesophageal reflux disease, unspecified whether esophagitis present Assessment & Plan: She has GERD with worsened symptoms after discontinuing omeprazole , likely due to increased caffeine and citrus intake. Reports severe symptoms including difficulty swallowing and mucus production. Comfortable with resuming omeprazole . Prescribe omeprazole  20 mg, to be taken twice daily or two capsules at once if needed. Advise dietary modifications to avoid GERD triggers, such as caffeine and citrus foods. We will continue to monitor.   Orders: -     Omeprazole ; Take 1 capsule (20 mg total) by mouth 2 (two) times daily before a meal.  Dispense: 180 capsule; Refill: 3      Return in  about 6 weeks (around 08/21/2024) for Follow up.   Leron Glance, NP-C [facility]

## 2024-07-19 ENCOUNTER — Encounter: Payer: Self-pay | Admitting: Nurse Practitioner

## 2024-07-19 NOTE — Assessment & Plan Note (Signed)
 She has obesity and previously used Zepbound , preferring it over Qsymia  due to concerns about phentermine . Minimal weight loss occurred due to inconsistent Qsymia  use and lifestyle changes. Discontinue Qsymia  and prescribe Zepbound , starting at 2.5 mg weekly for four weeks. Counseled on the risk of pancreatitis and gallbladder disease. Discussed the risk of nausea. Advised to discontinue the Zepbound  and contact us  immediately if they develop abdominal pain. If they develop excessive nausea they will contact us  right away. I discussed that medullary thyroid  cancer has been seen in rats studies. The patient confirmed no personal or family history of thyroid  cancer, parathyroid cancer, or adrenal gland cancer. Discussed that we thus far have not seen medullary thyroid  cancer result from use of this type of medication in humans. Advised to monitor the thyroid  area and contact us  for any lumps, swelling, trouble swallowing, or any other changes in this area.  Discussed goal weight loss of 1 to 2 pounds a week while on this medication. Discussed the importance of healthy diet, exercise and lifestyle modifications even with this medication. Schedule a follow-up in six weeks to assess weight and treatment response.

## 2024-07-19 NOTE — Assessment & Plan Note (Signed)
 She has GERD with worsened symptoms after discontinuing omeprazole , likely due to increased caffeine and citrus intake. Reports severe symptoms including difficulty swallowing and mucus production. Comfortable with resuming omeprazole . Prescribe omeprazole  20 mg, to be taken twice daily or two capsules at once if needed. Advise dietary modifications to avoid GERD triggers, such as caffeine and citrus foods. We will continue to monitor.

## 2024-08-08 ENCOUNTER — Other Ambulatory Visit (HOSPITAL_COMMUNITY): Payer: Self-pay

## 2024-08-08 ENCOUNTER — Telehealth: Payer: Self-pay

## 2024-08-08 ENCOUNTER — Encounter: Payer: Self-pay | Admitting: Nurse Practitioner

## 2024-08-08 NOTE — Telephone Encounter (Signed)
 PA needed for Zepbound

## 2024-08-08 NOTE — Telephone Encounter (Signed)
 Patient cancelled her 08/21/2024 appointment with Leron Glance, FNP-C, via MyChart, with the following comment:  Patient comments: I have not received the medication we were scheduled to check on. Stated I still need authorization.

## 2024-08-08 NOTE — Telephone Encounter (Signed)
 Message sent to PA team to start a PA on today

## 2024-08-08 NOTE — Telephone Encounter (Signed)
New prior auth submitted

## 2024-08-13 ENCOUNTER — Other Ambulatory Visit (HOSPITAL_COMMUNITY): Payer: Self-pay

## 2024-08-13 NOTE — Telephone Encounter (Signed)
 Hey I tried to submit prior auth, and ended up having to call plan. They stated patient does not have an active plan on file. This is the info I have.

## 2024-08-15 ENCOUNTER — Other Ambulatory Visit (HOSPITAL_COMMUNITY): Payer: Self-pay

## 2024-08-21 ENCOUNTER — Ambulatory Visit: Admitting: Nurse Practitioner

## 2024-09-13 ENCOUNTER — Other Ambulatory Visit (HOSPITAL_COMMUNITY): Payer: Self-pay

## 2024-09-20 ENCOUNTER — Telehealth: Payer: Self-pay

## 2024-09-20 NOTE — Telephone Encounter (Signed)
 I submitted prior auth thru latent on 08/23/2024. Per latent I received this message. Im calling insurance to submit over the phone.

## 2024-10-05 ENCOUNTER — Encounter: Payer: Self-pay | Admitting: Nurse Practitioner

## 2024-10-05 NOTE — Telephone Encounter (Signed)
 Any update?

## 2024-10-17 ENCOUNTER — Other Ambulatory Visit (HOSPITAL_COMMUNITY): Payer: Self-pay

## 2024-10-17 ENCOUNTER — Telehealth: Payer: Self-pay

## 2024-10-17 NOTE — Telephone Encounter (Signed)
 Pharmacy Patient Advocate Encounter   Received notification from Physician's Office that prior authorization for Zepbound  2.5 mg/0.5 ml pen is required/requested.   Insurance verification completed.   The patient is insured through [facility].   Per test claim: PA required; PA submitted to above mentioned insurance via Prompt PA Key/confirmation #/EOC [id number] Status is pending

## 2024-10-24 DIAGNOSIS — B349 Viral infection, unspecified: Secondary | ICD-10-CM | POA: Diagnosis not present

## 2024-10-25 NOTE — Telephone Encounter (Signed)
 Pharmacy Patient Advocate Encounter  Received notification from [employer] that Prior Authorization for Zepbound  2.5 mg/0.5 ml pen  has been APPROVED from 10/23/24 to 10/21/25   PA #/Case ID/Reference #: [id number]

## 2024-11-01 ENCOUNTER — Other Ambulatory Visit: Payer: Self-pay | Admitting: Nurse Practitioner

## 2024-11-01 DIAGNOSIS — Z1231 Encounter for screening mammogram for malignant neoplasm of breast: Secondary | ICD-10-CM

## 2024-11-20 DIAGNOSIS — E66811 Obesity, class 1: Secondary | ICD-10-CM

## 2024-11-22 ENCOUNTER — Encounter: Payer: Self-pay | Admitting: Nurse Practitioner

## 2024-11-22 DIAGNOSIS — E66811 Obesity, class 1: Secondary | ICD-10-CM

## 2024-11-23 NOTE — Telephone Encounter (Signed)
 PA for zepbound  is needed

## 2024-11-28 ENCOUNTER — Other Ambulatory Visit: Payer: Self-pay | Admitting: Nurse Practitioner

## 2024-11-28 DIAGNOSIS — E66811 Obesity, class 1: Secondary | ICD-10-CM

## 2024-11-28 MED ORDER — ZEPBOUND 5 MG/0.5ML ~~LOC~~ SOAJ: 5.0000 mg | SUBCUTANEOUS | 2 refills | Status: DC

## 2024-11-28 MED ORDER — ZEPBOUND 5 MG/0.5ML ~~LOC~~ SOAJ: 5.0000 mg | SUBCUTANEOUS | 2 refills | Status: AC

## 2024-11-29 ENCOUNTER — Other Ambulatory Visit: Payer: Self-pay | Admitting: Nurse Practitioner

## 2024-11-29 DIAGNOSIS — E66811 Obesity, class 1: Secondary | ICD-10-CM

## 2024-11-30 ENCOUNTER — Telehealth: Payer: Self-pay

## 2024-11-30 NOTE — Telephone Encounter (Signed)
"  PA needed for Zepbound     "

## 2024-12-07 ENCOUNTER — Ambulatory Visit: Admitting: Nurse Practitioner

## 2024-12-07 ENCOUNTER — Encounter: Payer: Self-pay | Admitting: Nurse Practitioner

## 2024-12-07 VITALS — BP 120/86 | HR 58 | Temp 98.3°F | Ht 65.0 in | Wt 207.2 lb

## 2024-12-07 DIAGNOSIS — E66811 Obesity, class 1: Secondary | ICD-10-CM

## 2024-12-07 DIAGNOSIS — Z6833 Body mass index (BMI) 33.0-33.9, adult: Secondary | ICD-10-CM | POA: Diagnosis not present

## 2024-12-07 DIAGNOSIS — E6609 Other obesity due to excess calories: Secondary | ICD-10-CM | POA: Diagnosis not present

## 2024-12-07 NOTE — Progress Notes (Signed)
 " Emily Glance, NP-C Phone: [phone]  Emily Griffin is a 39 y.o. female who presents today for follow up.   Discussed the use of AI scribe software for clinical note transcription with the patient, who gave verbal consent to proceed.  History of Present Illness   Emily Griffin is a 39 year old female who presents for a follow-up on Zepbound  treatment.  She started Zepbound  in August but faced insurance approval issues, delaying access until December through [employer]. She only obtained the medication for that month. Her insurance changed on January 1st, leading to a high cost of $1,000 due to a need for prior authorization.  She completed the 2.5 mg dose of Zepbound  without experiencing nausea, vomiting, abdominal pain, or constipation. The medication significantly reduced her appetite, allowing her to cut back on food intake. She maintains a diet of fruits, ham slices, and cheese slices, and has increased her physical activity to 20 to 30 minutes of exercise daily. She reported a weight loss of at least 5 pounds since starting the 2.5 mg dose. Her weight had increased since August but is now decreasing again.  No nausea, vomiting, abdominal pain, or constipation while on the 2.5 mg dose of Zepbound . She reported back pain and a reduction in water intake, which she believes may have contributed to the dark urine episode.      Tobacco Use History[1]  Medications Ordered Prior to Encounter[2]   ROS see history of present illness  Objective  Physical Exam Vitals:   12/07/24 0958  BP: 120/86  Pulse: (!) 58  Temp: 98.3 F (36.8 C)  SpO2: 99%    BP Readings from Last 3 Encounters:  12/07/24 120/86  07/10/24 118/88  03/20/24 103/71   Wt Readings from Last 3 Encounters:  12/07/24 207 lb 3.2 oz (94 kg)  07/10/24 201 lb (91.2 kg)  03/20/24 199 lb 6 oz (90.4 kg)    Physical Exam Constitutional:      General: She is not in acute distress.    Appearance: Normal appearance.   HENT:     Head: Normocephalic.  Cardiovascular:     Rate and Rhythm: Normal rate and regular rhythm.     Heart sounds: Normal heart sounds.  Pulmonary:     Effort: Pulmonary effort is normal.     Breath sounds: Normal breath sounds.  Skin:    General: Skin is warm and dry.  Neurological:     General: No focal deficit present.     Mental Status: She is alert.  Psychiatric:        Mood and Affect: Mood normal.        Behavior: Behavior normal.      Assessment/Plan: Please see individual problem list.  Class 1 obesity due to excess calories without serious comorbidity with body mass index (BMI) of 33.0 to 33.9 in adult Assessment & Plan: Managed with Zepbound  2.5 mg since December, resulting in a 5-pound weight loss without adverse effects. Insurance issues delayed the increase to 5 mg. Her current weight is 6 pounds higher than in August. The goal is a 1-2 pound weight loss per week, with the 5 mg dose expected to enhance weight loss. Sent a prescription for 5 mg Zepbound  and coordinated with the pharmacy team to resolve insurance issues. Plan follow-up in 6-8 weeks after starting the 5 mg dose to assess weight loss and medication tolerance. Encourage healthy diet and regular exercise.       Return in about 2 months (  around 02/04/2025) for Follow up.   Emily Glance, NP-C [facility] [place]     [1]  Social History Tobacco Use  Smoking Status Never  Smokeless Tobacco Never  [2]  Current Outpatient Medications on File Prior to Visit  Medication Sig Dispense Refill   albuterol  (VENTOLIN  HFA) 108 (90 Base) MCG/ACT inhaler Inhale 2 puffs into the lungs every 6 (six) hours as needed for wheezing or shortness of breath. 18 g 2   fexofenadine (ALLEGRA ALLERGY) 180 MG tablet Take 180 mg by mouth daily.     fluticasone (FLONASE) 50 MCG/ACT nasal spray Place into both nostrils daily.     levonorgestrel  (MIRENA ) 20 MCG/DAY IUD 1 each by Intrauterine route  once.     Multiple Vitamins-Minerals (WOMENS MULTIVITAMIN PO) Take by mouth daily. Vitafusion     omeprazole  (PRILOSEC) 20 MG capsule Take 1 capsule (20 mg total) by mouth 2 (two) times daily before a meal. 180 capsule 3   tirzepatide  (ZEPBOUND ) 5 MG/0.5ML Pen Inject 5 mg into the skin once a week. 2 mL 2   VITAMIN D  PO Take by mouth. (Patient not taking: Reported on 07/10/2024)     No current facility-administered medications on file prior to visit.   "

## 2024-12-07 NOTE — Assessment & Plan Note (Signed)
 Managed with Zepbound  2.5 mg since December, resulting in a 5-pound weight loss without adverse effects. Insurance issues delayed the increase to 5 mg. Her current weight is 6 pounds higher than in August. The goal is a 1-2 pound weight loss per week, with the 5 mg dose expected to enhance weight loss. Sent a prescription for 5 mg Zepbound  and coordinated with the pharmacy team to resolve insurance issues. Plan follow-up in 6-8 weeks after starting the 5 mg dose to assess weight loss and medication tolerance. Encourage healthy diet and regular exercise.

## 2024-12-11 ENCOUNTER — Encounter

## 2024-12-11 ENCOUNTER — Ambulatory Visit
Admission: RE | Admit: 2024-12-11 | Discharge: 2024-12-11 | Disposition: A | Discharge status: Home / Self Care | Source: Ambulatory Visit | Attending: Nurse Practitioner | Admitting: Nurse Practitioner

## 2024-12-11 DIAGNOSIS — Z1231 Encounter for screening mammogram for malignant neoplasm of breast: Secondary | ICD-10-CM | POA: Insufficient documentation

## 2024-12-12 ENCOUNTER — Encounter: Payer: Self-pay | Admitting: Nurse Practitioner

## 2024-12-13 ENCOUNTER — Telehealth: Payer: Self-pay

## 2024-12-13 ENCOUNTER — Other Ambulatory Visit (HOSPITAL_COMMUNITY): Payer: Self-pay

## 2024-12-13 ENCOUNTER — Other Ambulatory Visit: Payer: Self-pay | Admitting: Nurse Practitioner

## 2024-12-13 DIAGNOSIS — R928 Other abnormal and inconclusive findings on diagnostic imaging of breast: Secondary | ICD-10-CM

## 2024-12-13 NOTE — Telephone Encounter (Signed)
 Pharmacy Patient Advocate Encounter   Received notification from [employer] that prior authorization for Zepbound  5mg /0.63ml is required/requested.   Insurance verification completed.   The patient is insured through [facility].   Per test claim: PA required; PA submitted to above mentioned insurance via Latent Key/confirmation #/EOC [id number] Status is pending

## 2024-12-18 ENCOUNTER — Ambulatory Visit
Admission: RE | Admit: 2024-12-18 | Discharge: 2024-12-18 | Disposition: A | Source: Ambulatory Visit | Attending: Nurse Practitioner | Admitting: Nurse Practitioner

## 2024-12-18 ENCOUNTER — Other Ambulatory Visit (HOSPITAL_COMMUNITY): Payer: Self-pay

## 2024-12-18 DIAGNOSIS — R928 Other abnormal and inconclusive findings on diagnostic imaging of breast: Secondary | ICD-10-CM

## 2024-12-18 NOTE — Telephone Encounter (Signed)
 Additional information has been requested from the patient's insurance in order to proceed with the prior authorization request. Requested information has been sent, or form has been filled out and faxed back to [phone]  Phone# [phone]

## 2025-01-22 ENCOUNTER — Ambulatory Visit: Admitting: Nurse Practitioner

## 2025-02-05 ENCOUNTER — Ambulatory Visit: Admitting: Nurse Practitioner

## 2025-03-11 ENCOUNTER — Encounter: Admitting: Family Medicine
